# Patient Record
Sex: Female | Born: 1985 | Race: White | Hispanic: Yes | Marital: Single | Smoking: Never smoker
Health system: Southern US, Community
[De-identification: ages and names within clinical notes are randomized; demographics above are authoritative.]

---
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
## 2005-06-30 ENCOUNTER — Inpatient Hospital Stay (HOSPITAL_COMMUNITY): Admission: AD | Admit: 2005-06-30 | Discharge: 2005-06-30 | Payer: Self-pay | Admitting: Obstetrics

## 2005-08-20 ENCOUNTER — Inpatient Hospital Stay (HOSPITAL_COMMUNITY): Admission: AD | Admit: 2005-08-20 | Discharge: 2005-08-22 | Payer: Self-pay | Admitting: Obstetrics

## 2007-06-14 ENCOUNTER — Inpatient Hospital Stay (HOSPITAL_COMMUNITY): Admission: AD | Admit: 2007-06-14 | Discharge: 2007-06-14 | Payer: Self-pay | Admitting: Obstetrics

## 2007-06-26 ENCOUNTER — Inpatient Hospital Stay (HOSPITAL_COMMUNITY): Admission: AD | Admit: 2007-06-26 | Discharge: 2007-06-26 | Payer: Self-pay | Admitting: Obstetrics

## 2007-08-08 ENCOUNTER — Encounter (INDEPENDENT_AMBULATORY_CARE_PROVIDER_SITE_OTHER): Payer: Self-pay | Admitting: Obstetrics

## 2007-08-08 ENCOUNTER — Inpatient Hospital Stay (HOSPITAL_COMMUNITY): Admission: RE | Admit: 2007-08-08 | Discharge: 2007-08-11 | Payer: Self-pay | Admitting: Obstetrics

## 2010-05-19 NOTE — Op Note (Signed)
NAMECHARLI, [name] Stewart           ACCOUNT NO.:  [id number]   MEDICAL RECORD NO.:  [id number]          PATIENT TYPE:  INP   LOCATION:  9132                          FACILITY:  [facility]   PHYSICIAN:  Kathreen Cosier, M.D.DATE OF BIRTH:  1985-09-05   DATE OF PROCEDURE:  08/08/2007  DATE OF DISCHARGE:                               OPERATIVE REPORT   PREOPERATIVE DIAGNOSIS:  Twin gestation at term with both breech  presentation.   POSTOPERATIVE DIAGNOSIS:  Twin gestation at term with both breech  presentation.   SURGEON:  Kathreen Cosier, MD   FIRST ASSISTANT:  Charles A. Clearance Coots, MD   ANESTHESIA:  Spinal.   PROCEDURE:  The patient was placed on the operating table in supine  position after the spinal administered.  Abdomen was prepped and draped.  Bladder was emptied with Foley catheter.  A transverse suprapubic  incision made and carried down through the rectus fascia.  Fascia was  cleaned and incised length of the incision.  Recti muscles were  retracted laterally.  Peritoneum was incised longitudinally.  A  transverse incision was made in the visceral peritoneum above the  bladder.  Bladder was mobilized inferiorly.  Transverse lower uterine  incision was made.  The membranes were ruptured around twin A and the  fluid was clear.  A frank breech female at 10:43, Apgar 8 and 8, weighing  6 pounds 11 ounces.  Twin B, the double footling breech.  The membranes  were ruptured.  Fluid clear and delivered via breech extraction at 10:45  of a female, Apgar 5 and 8, weighing 5 pounds 14 ounces.  Had two  placentas and they were removed manually and sent to pathology.  Uterine  cavity cleaned with dry laps.  Uterine incision closed in one layer with  continuous    Dictation ended at this point.           ______________________________  Kathreen Cosier, M.D.     BAM/MEDQ  D:  08/08/2007  T:  08/09/2007  Job:  [id number]

## 2010-05-22 NOTE — Discharge Summary (Signed)
[name], [name] NO.:  [id number]   MEDICAL RECORD NO.:  [id number]          PATIENT TYPE:  MAT   LOCATION:  MATC                          [facility]  [facility]   PHYSICIAN:  Kathreen Cosier, M.D.DATE OF BIRTH:  02-03-1985   DATE OF ADMISSION:  DATE OF DISCHARGE:                               DISCHARGE SUMMARY   The patient is a 25 year old gravida 3, para 2-0-0-2, [facility] August 27, 2007, who was pregnant with twin gestation at term, both breech.  She  was followed with nonstress tests and ultrasound at [facility] and  then she was brought in for C-section, because of the twins at term,  breech.  She underwent a primary low-transverse cesarean section, twin A  with Apgar 8 and 8, weighing 6 pounds 11.  Twin B, female, Apgar 8 and  8,  weighing 5 pounds 14, double footling.  Postop, the patient did  well.  Her hemoglobin was 8.8 and she was started on ferrous sulfate  twice a day.  She was discharged on the third postoperative day.  Ambulatory, on a regular diet, and to see me in 6 weeks.   DISCHARGE DIAGNOSIS:  Status post primary low-transverse cesarean  section at term for twin gestation, both breech.           ______________________________  Kathreen Cosier, M.D.     BAM/MEDQ  D:  08/23/2007  T:  08/23/2007  Job:  [id number]

## 2010-10-01 LAB — FETAL FIBRONECTIN: Fetal Fibronectin: NEGATIVE

## 2010-10-01 LAB — RH IMMUNE GLOBULIN WORKUP (NOT WOMEN'S HOSP)
ABO/RH(D): B NEG
Antibody Screen: NEGATIVE

## 2010-10-01 LAB — WET PREP, GENITAL: Trich, Wet Prep: NONE SEEN

## 2010-10-02 LAB — RH IMMUNE GLOB WKUP(>/=20WKS)(NOT WOMEN'S HOSP)

## 2010-10-02 LAB — CBC
HCT: 26.2 — ABNORMAL LOW
HCT: 37.5
Hemoglobin: 12.5
Hemoglobin: 8.8 — ABNORMAL LOW
MCHC: 33.6
MCV: 94.2
Platelets: 159
Platelets: 219
RBC: 2.78 — ABNORMAL LOW
RBC: 3.98
WBC: 7.9

## 2014-10-17 LAB — OB RESULTS CONSOLE HEPATITIS B SURFACE ANTIGEN: HEP B S AG: NEGATIVE

## 2014-10-17 LAB — OB RESULTS CONSOLE RUBELLA ANTIBODY, IGM: RUBELLA: NON-IMMUNE/NOT IMMUNE

## 2014-10-17 LAB — OB RESULTS CONSOLE RPR: RPR: NONREACTIVE

## 2014-10-17 LAB — OB RESULTS CONSOLE HIV ANTIBODY (ROUTINE TESTING): HIV: NONREACTIVE

## 2014-10-17 LAB — OB RESULTS CONSOLE ABO/RH: RH TYPE: NEGATIVE

## 2014-10-17 LAB — OB RESULTS CONSOLE GC/CHLAMYDIA
Chlamydia: NEGATIVE
Gonorrhea: NEGATIVE

## 2015-03-13 ENCOUNTER — Encounter (HOSPITAL_COMMUNITY): Payer: Self-pay

## 2015-03-13 ENCOUNTER — Other Ambulatory Visit (HOSPITAL_COMMUNITY): Payer: Self-pay | Admitting: Obstetrics

## 2015-03-13 ENCOUNTER — Ambulatory Visit (HOSPITAL_COMMUNITY)
Admission: RE | Admit: 2015-03-13 | Discharge: 2015-03-13 | Disposition: A | Discharge status: Home / Self Care | Payer: Self-pay | Source: Ambulatory Visit | Attending: Obstetrics | Admitting: Obstetrics

## 2015-03-13 DIAGNOSIS — Z3A28 28 weeks gestation of pregnancy: Secondary | ICD-10-CM

## 2015-03-13 DIAGNOSIS — O30042 Twin pregnancy, dichorionic/diamniotic, second trimester: Secondary | ICD-10-CM

## 2015-03-13 DIAGNOSIS — Z3689 Encounter for other specified antenatal screening: Secondary | ICD-10-CM

## 2015-03-13 DIAGNOSIS — O30043 Twin pregnancy, dichorionic/diamniotic, third trimester: Secondary | ICD-10-CM | POA: Insufficient documentation

## 2015-03-13 DIAGNOSIS — Z36 Encounter for antenatal screening of mother: Secondary | ICD-10-CM | POA: Insufficient documentation

## 2015-04-03 ENCOUNTER — Other Ambulatory Visit (HOSPITAL_COMMUNITY): Payer: Self-pay | Admitting: Obstetrics

## 2015-04-03 DIAGNOSIS — O30043 Twin pregnancy, dichorionic/diamniotic, third trimester: Secondary | ICD-10-CM

## 2015-04-03 DIAGNOSIS — Z3A28 28 weeks gestation of pregnancy: Secondary | ICD-10-CM

## 2015-04-14 ENCOUNTER — Other Ambulatory Visit (HOSPITAL_COMMUNITY): Payer: Self-pay | Admitting: Obstetrics

## 2015-04-14 ENCOUNTER — Ambulatory Visit (HOSPITAL_COMMUNITY)
Admission: RE | Admit: 2015-04-14 | Discharge: 2015-04-14 | Disposition: A | Discharge status: Home / Self Care | Payer: Self-pay | Source: Ambulatory Visit | Attending: Obstetrics | Admitting: Obstetrics

## 2015-04-14 DIAGNOSIS — Z3A28 28 weeks gestation of pregnancy: Secondary | ICD-10-CM

## 2015-04-14 DIAGNOSIS — O30043 Twin pregnancy, dichorionic/diamniotic, third trimester: Secondary | ICD-10-CM

## 2015-04-14 DIAGNOSIS — Z3A33 33 weeks gestation of pregnancy: Secondary | ICD-10-CM

## 2015-05-01 ENCOUNTER — Other Ambulatory Visit (HOSPITAL_COMMUNITY): Payer: Self-pay | Admitting: Obstetrics

## 2015-05-01 ENCOUNTER — Other Ambulatory Visit: Payer: Self-pay | Admitting: Obstetrics

## 2015-05-01 DIAGNOSIS — O30009 Twin pregnancy, unspecified number of placenta and unspecified number of amniotic sacs, unspecified trimester: Secondary | ICD-10-CM

## 2015-05-01 DIAGNOSIS — Z3A39 39 weeks gestation of pregnancy: Secondary | ICD-10-CM

## 2015-05-01 DIAGNOSIS — O321XX1 Maternal care for breech presentation, fetus 1: Secondary | ICD-10-CM

## 2015-05-01 LAB — OB RESULTS CONSOLE GBS: GBS: POSITIVE

## 2015-05-12 ENCOUNTER — Encounter (HOSPITAL_COMMUNITY): Payer: Self-pay

## 2015-05-12 ENCOUNTER — Telehealth (HOSPITAL_COMMUNITY): Payer: Self-pay | Admitting: *Deleted

## 2015-05-12 NOTE — Telephone Encounter (Signed)
Preadmission screen  

## 2015-05-13 ENCOUNTER — Encounter (HOSPITAL_COMMUNITY): Payer: Self-pay

## 2015-05-13 NOTE — Telephone Encounter (Signed)
Interpreter number [phone]

## 2015-05-13 NOTE — H&P (Signed)
NAMFransisca Stewart:  Stewart, [name]           ACCOUNT NO.:  [id number]  MEDICAL RECORD NO.:  [id number]  LOCATION:  MFM                           [facility]ILITY:  [facility]  PHYSICIAN:  Kathreen CosierBernard A. Dorenda Pfannenstiel, M.D.DATE OF BIRTH:  [birth year]  DATE OF ADMISSION:  04/14/2015 DATE OF DISCHARGE:  04/14/2015                             HISTORY & PHYSICAL   HISTORY OF PRESENT ILLNESS:  The patient is a 30 year old, gravida 5, para 4-0-0-4, who had 1 C section in the past.  She is pregnant with twins due Jun 01, 2015.  Throughout this pregnancy twin A has been breech and the patient is for repeat C-section.  She also has a positive GBS and her initial Glucola screen was abnormal.  She had 3 hour GTT which was normal.  She was also Rh negative and received RhoGAM.  PAST MEDICAL HISTORY:  Negative.  PAST SURGICAL HISTORY:  She had a C-section x1 and 2 vaginal deliveries. The last C-section in 09 was for twin gestation also.  SOCIAL HISTORY:  Negative.  SYSTEM REVIEW:  Negative.  PHYSICAL EXAM:  GENERAL:  Well-developed female in no distress. HEENT:  Negative. LUNGS:  Clear to P and A. HEART:  Regular rhythm.  No murmurs, no gallops. Breasts:  Negative. ABDOMEN:  Distended with twin gestation. PELVIC:  Cervix closed. EXTREMITIES:  Negative.          ______________________________ Kathreen CosierBernard A. [name] Stewart, M.D.     BAM/MEDQ  D:  05/12/2015  T:  05/13/2015  Job:  [id number]

## 2015-05-16 ENCOUNTER — Ambulatory Visit (HOSPITAL_COMMUNITY): Admission: RE | Admit: 2015-05-16 | Payer: Self-pay | Source: Ambulatory Visit

## 2015-05-19 ENCOUNTER — Encounter (HOSPITAL_COMMUNITY)
Admission: RE | Admit: 2015-05-19 | Discharge: 2015-05-19 | Disposition: A | Discharge status: Home / Self Care | Payer: Self-pay | Source: Ambulatory Visit | Attending: Obstetrics | Admitting: Obstetrics

## 2015-05-19 DIAGNOSIS — Z01812 Encounter for preprocedural laboratory examination: Secondary | ICD-10-CM | POA: Insufficient documentation

## 2015-05-19 DIAGNOSIS — Z3483 Encounter for supervision of other normal pregnancy, third trimester: Secondary | ICD-10-CM | POA: Insufficient documentation

## 2015-05-19 LAB — CBC
HEMATOCRIT: 33.4 % — AB (ref 36.0–46.0)
HEMOGLOBIN: 11.5 g/dL — AB (ref 12.0–15.0)
MCH: 31.5 pg (ref 26.0–34.0)
MCHC: 34.4 g/dL (ref 30.0–36.0)
MCV: 91.5 fL (ref 78.0–100.0)
Platelets: 138 10*3/uL — ABNORMAL LOW (ref 150–400)
RBC: 3.65 MIL/uL — AB (ref 3.87–5.11)
RDW: 12.7 % (ref 11.5–15.5)
WBC: 6 10*3/uL (ref 4.0–10.5)

## 2015-05-19 NOTE — Patient Instructions (Signed)
20 [name]  05/19/2015   Your procedure is scheduled on: 05/20/2015  [facility] through the [facility] Entr[facility] of [facility] at 0600 AM.  Pick up the phone at the desk and dial [phone].   Call this number if you have problems the morning of surgery: [phone]   Remember:   Do not eat food:After Midnight.  Do not drink clear liquids: After Midnight.  Take these medicines the morning of surgery with A SIP OF WATER: none   Do not wear jewelry, make-up or nail polish.  Do not wear lotions, powders, or perfumes. You may wear deodorant.  Do not shave 48 hours prior to surgery.  Do not bring valuables to the hospital.  [facility] is not   responsible for any belongings or valuables brought to the hospital.  Contacts, dentures or bridgework may not be worn into surgery.  Leave suitcase in the car. After surgery it may be brought to your room.  For patients admitted to the hospital, checkout time is 11:00 AM the day of              discharge.   Patients discharged the day of surgery will not be allowed to drive             home.  Name and phone number of your driver: na  Special Instructions:   Shower using CHG 2 nights before surgery and the night before surgery.  If you shower the day of surgery use CHG.  Use special wash - you have one bottle of CHG for all showers.  You should use approximately 1/3 of the bottle for each shower.   Please read over the following fact sheets that you were given:   Surgical Site Infection Prevention

## 2015-05-19 NOTE — Anesthesia Preprocedure Evaluation (Addendum)
Anesthesia Evaluation  Patient identified by MRN, date of birth, ID band Patient awake    Reviewed: Allergy & Precautions, NPO status , Patient's Chart, lab work & pertinent test results  Airway Mallampati: II  TM Distance: >3 FB Neck ROM: Full    Dental  (+) Teeth Intact   Pulmonary neg pulmonary ROS,    breath sounds clear to auscultation       Cardiovascular negative cardio ROS   Rhythm:Regular Rate:Normal     Neuro/Psych negative neurological ROS     GI/Hepatic negative GI ROS, Neg liver ROS,   Endo/Other  negative endocrine ROS  Renal/GU negative Renal ROS     Musculoskeletal   Abdominal   Peds  Hematology Plts 138K   Anesthesia Other Findings   Reproductive/Obstetrics (+) Pregnancy Previous c/s x1. Vaginal delivery x 2. Now with twin pregnancy with twin A breech for repeat c/s.                            Lab Results  Component Value Date   WBC 6.0 05/19/2015   HGB 11.5* 05/19/2015   HCT 33.4* 05/19/2015   MCV 91.5 05/19/2015   PLT 138* 05/19/2015   No results found for: CREATININE, BUN, NA, K, CL, CO2  Anesthesia Physical Anesthesia Plan  ASA: II  Anesthesia Plan: Spinal   Post-op Pain Management:    Induction:   Airway Management Planned: Natural Airway  Additional Equipment:   Intra-op Plan:   Post-operative Plan:   Informed Consent: I have reviewed the patients History and Physical, chart, labs and discussed the procedure including the risks, benefits and alternatives for the proposed anesthesia with the patient or authorized representative who has indicated his/her understanding and acceptance.     Plan Discussed with: CRNA  Anesthesia Plan Comments:        Anesthesia Quick Evaluation

## 2015-05-20 ENCOUNTER — Inpatient Hospital Stay (HOSPITAL_COMMUNITY): Payer: Medicaid Other | Admitting: Anesthesiology

## 2015-05-20 ENCOUNTER — Inpatient Hospital Stay (HOSPITAL_COMMUNITY)
Admission: RE | Admit: 2015-05-20 | Discharge: 2015-05-23 | DRG: 765 | Disposition: A | Discharge status: Home / Self Care | Payer: Medicaid Other | Source: Ambulatory Visit | Attending: Obstetrics | Admitting: Obstetrics

## 2015-05-20 ENCOUNTER — Encounter (HOSPITAL_COMMUNITY)
Admission: RE | Disposition: A | Discharge status: Home / Self Care | Payer: Self-pay | Source: Ambulatory Visit | Attending: Obstetrics

## 2015-05-20 ENCOUNTER — Encounter (HOSPITAL_COMMUNITY): Payer: Self-pay

## 2015-05-20 DIAGNOSIS — Z3A38 38 weeks gestation of pregnancy: Secondary | ICD-10-CM

## 2015-05-20 DIAGNOSIS — O30043 Twin pregnancy, dichorionic/diamniotic, third trimester: Secondary | ICD-10-CM | POA: Diagnosis present

## 2015-05-20 DIAGNOSIS — O328XX2 Maternal care for other malpresentation of fetus, fetus 2: Secondary | ICD-10-CM | POA: Diagnosis present

## 2015-05-20 DIAGNOSIS — O30013 Twin pregnancy, monochorionic/monoamniotic, third trimester: Secondary | ICD-10-CM | POA: Diagnosis not present

## 2015-05-20 DIAGNOSIS — O328XX1 Maternal care for other malpresentation of fetus, fetus 1: Principal | ICD-10-CM | POA: Diagnosis present

## 2015-05-20 DIAGNOSIS — O30003 Twin pregnancy, unspecified number of placenta and unspecified number of amniotic sacs, third trimester: Secondary | ICD-10-CM | POA: Diagnosis not present

## 2015-05-20 DIAGNOSIS — Z98891 History of uterine scar from previous surgery: Secondary | ICD-10-CM

## 2015-05-20 LAB — RPR: RPR: NONREACTIVE

## 2015-05-20 LAB — PREPARE RBC (CROSSMATCH)

## 2015-05-20 SURGERY — Surgical Case
Anesthesia: Spinal

## 2015-05-20 MED ORDER — LACTATED RINGERS IV SOLN
INTRAVENOUS | Status: DC | PRN
  Administered 2015-05-20: 08:00:00 via INTRAVENOUS

## 2015-05-20 MED ORDER — SCOPOLAMINE 1 MG/3DAYS TD PT72
1.0000 | MEDICATED_PATCH | Freq: Once | TRANSDERMAL | Status: DC
  Administered 2015-05-20: 1.5 mg via TRANSDERMAL

## 2015-05-20 MED ORDER — LACTATED RINGERS IV SOLN
Freq: Once | INTRAVENOUS | Status: AC
  Administered 2015-05-20: 07:00:00 via INTRAVENOUS

## 2015-05-20 MED ORDER — NALBUPHINE HCL 10 MG/ML IJ SOLN: 5.0000 mg | Freq: Once | INTRAMUSCULAR | Status: DC | PRN

## 2015-05-20 MED ORDER — DIPHENHYDRAMINE HCL 25 MG PO CAPS
25.0000 mg | ORAL_CAPSULE | ORAL | Status: DC | PRN
  Filled 2015-05-20: qty 1

## 2015-05-20 MED ORDER — MORPHINE SULFATE (PF) 0.5 MG/ML IJ SOLN
INTRAMUSCULAR | Status: AC
  Filled 2015-05-20: qty 10

## 2015-05-20 MED ORDER — SENNOSIDES-DOCUSATE SODIUM 8.6-50 MG PO TABS
2.0000 | ORAL_TABLET | ORAL | Status: DC
  Administered 2015-05-21 – 2015-05-23 (×3): 2 via ORAL
  Filled 2015-05-20 (×3): qty 2

## 2015-05-20 MED ORDER — DIPHENHYDRAMINE HCL 25 MG PO CAPS: 25.0000 mg | ORAL_CAPSULE | ORAL | Status: DC | PRN

## 2015-05-20 MED ORDER — OXYCODONE HCL 5 MG PO TABS
5.0000 mg | ORAL_TABLET | Freq: Once | ORAL | Status: AC
  Administered 2015-05-20: 5 mg via ORAL
  Filled 2015-05-20: qty 1

## 2015-05-20 MED ORDER — OXYTOCIN 10 UNIT/ML IJ SOLN
40.0000 [IU] | INTRAVENOUS | Status: DC | PRN
  Administered 2015-05-20: 40 [IU] via INTRAVENOUS

## 2015-05-20 MED ORDER — OXYTOCIN 40 UNITS IN LACTATED RINGERS INFUSION - SIMPLE MED: 2.5000 [IU]/h | INTRAVENOUS | Status: AC

## 2015-05-20 MED ORDER — PHENYLEPHRINE 8 MG IN D5W 100 ML (0.08MG/ML) PREMIX OPTIME
INJECTION | INTRAVENOUS | Status: DC | PRN
  Administered 2015-05-20: 60 ug/min via INTRAVENOUS

## 2015-05-20 MED ORDER — MEPERIDINE HCL 25 MG/ML IJ SOLN: 6.2500 mg | INTRAMUSCULAR | Status: DC | PRN

## 2015-05-20 MED ORDER — KETOROLAC TROMETHAMINE 30 MG/ML IJ SOLN: 30.0000 mg | Freq: Four times a day (QID) | INTRAMUSCULAR | Status: DC | PRN

## 2015-05-20 MED ORDER — SCOPOLAMINE 1 MG/3DAYS TD PT72
1.0000 | MEDICATED_PATCH | Freq: Once | TRANSDERMAL | Status: DC
Start: 2015-05-20 — End: 2015-05-20

## 2015-05-20 MED ORDER — NALBUPHINE HCL 10 MG/ML IJ SOLN: 5.0000 mg | INTRAMUSCULAR | Status: DC | PRN

## 2015-05-20 MED ORDER — SIMETHICONE 80 MG PO CHEW: 80.0000 mg | CHEWABLE_TABLET | ORAL | Status: DC | PRN

## 2015-05-20 MED ORDER — SODIUM CHLORIDE 0.9 % IR SOLN
Status: DC | PRN
  Administered 2015-05-20: 1000 mL

## 2015-05-20 MED ORDER — SODIUM CHLORIDE 0.9% FLUSH: 3.0000 mL | INTRAVENOUS | Status: DC | PRN

## 2015-05-20 MED ORDER — NALOXONE HCL 0.4 MG/ML IJ SOLN: 0.4000 mg | INTRAMUSCULAR | Status: DC | PRN

## 2015-05-20 MED ORDER — ONDANSETRON HCL 4 MG/2ML IJ SOLN: 4.0000 mg | Freq: Three times a day (TID) | INTRAMUSCULAR | Status: DC | PRN

## 2015-05-20 MED ORDER — DIBUCAINE 1 % RE OINT: 1.0000 "application " | TOPICAL_OINTMENT | RECTAL | Status: DC | PRN

## 2015-05-20 MED ORDER — ACETAMINOPHEN 500 MG PO TABS
1000.0000 mg | ORAL_TABLET | Freq: Once | ORAL | Status: AC
Start: 2015-05-20 — End: 2015-05-20
  Administered 2015-05-20: 1000 mg via ORAL
  Filled 2015-05-20: qty 2

## 2015-05-20 MED ORDER — OXYTOCIN 10 UNIT/ML IJ SOLN
INTRAMUSCULAR | Status: AC
  Filled 2015-05-20: qty 4

## 2015-05-20 MED ORDER — FENTANYL CITRATE (PF) 100 MCG/2ML IJ SOLN
INTRAMUSCULAR | Status: AC
  Filled 2015-05-20: qty 2

## 2015-05-20 MED ORDER — PRENATAL MULTIVITAMIN CH
1.0000 | ORAL_TABLET | Freq: Every day | ORAL | Status: DC
  Administered 2015-05-20 – 2015-05-22 (×3): 1 via ORAL
  Filled 2015-05-20 (×3): qty 1

## 2015-05-20 MED ORDER — PHENYLEPHRINE 8 MG IN D5W 100 ML (0.08MG/ML) PREMIX OPTIME
INJECTION | INTRAVENOUS | Status: AC
  Filled 2015-05-20: qty 100

## 2015-05-20 MED ORDER — NALOXONE HCL 2 MG/2ML IJ SOSY
1.0000 ug/kg/h | PREFILLED_SYRINGE | INTRAVENOUS | Status: DC | PRN
  Filled 2015-05-20: qty 2

## 2015-05-20 MED ORDER — METHYLERGONOVINE MALEATE 0.2 MG/ML IJ SOLN
0.2000 mg | Freq: Once | INTRAMUSCULAR | Status: AC
  Administered 2015-05-20: 0.2 mg via INTRAMUSCULAR

## 2015-05-20 MED ORDER — SIMETHICONE 80 MG PO CHEW
80.0000 mg | CHEWABLE_TABLET | ORAL | Status: DC
  Administered 2015-05-21 – 2015-05-23 (×4): 80 mg via ORAL
  Filled 2015-05-20 (×3): qty 1

## 2015-05-20 MED ORDER — DIPHENHYDRAMINE HCL 25 MG PO CAPS: 25.0000 mg | ORAL_CAPSULE | Freq: Four times a day (QID) | ORAL | Status: DC | PRN

## 2015-05-20 MED ORDER — ONDANSETRON HCL 4 MG/2ML IJ SOLN
INTRAMUSCULAR | Status: AC
  Filled 2015-05-20: qty 2

## 2015-05-20 MED ORDER — CEFAZOLIN SODIUM-DEXTROSE 2-4 GM/100ML-% IV SOLN
2.0000 g | INTRAVENOUS | Status: AC
  Administered 2015-05-20: 2 g via INTRAVENOUS

## 2015-05-20 MED ORDER — ONDANSETRON HCL 4 MG/2ML IJ SOLN
INTRAMUSCULAR | Status: DC | PRN
  Administered 2015-05-20: 4 mg via INTRAVENOUS

## 2015-05-20 MED ORDER — SIMETHICONE 80 MG PO CHEW
80.0000 mg | CHEWABLE_TABLET | Freq: Three times a day (TID) | ORAL | Status: DC
  Administered 2015-05-20 – 2015-05-23 (×7): 80 mg via ORAL
  Filled 2015-05-20 (×8): qty 1

## 2015-05-20 MED ORDER — SCOPOLAMINE 1 MG/3DAYS TD PT72: 1.0000 | MEDICATED_PATCH | Freq: Once | TRANSDERMAL | Status: DC

## 2015-05-20 MED ORDER — DIPHENHYDRAMINE HCL 50 MG/ML IJ SOLN: 12.5000 mg | INTRAMUSCULAR | Status: DC | PRN

## 2015-05-20 MED ORDER — LACTATED RINGERS IV SOLN
INTRAVENOUS | Status: DC
  Administered 2015-05-20: 125 mL/h via INTRAVENOUS

## 2015-05-20 MED ORDER — MENTHOL 3 MG MT LOZG: 1.0000 | LOZENGE | OROMUCOSAL | Status: DC | PRN

## 2015-05-20 MED ORDER — DEXTROSE 5 % IV SOLN: 1.0000 ug/kg/h | INTRAVENOUS | Status: DC | PRN

## 2015-05-20 MED ORDER — SCOPOLAMINE 1 MG/3DAYS TD PT72
MEDICATED_PATCH | TRANSDERMAL | Status: AC
  Administered 2015-05-20: 1.5 mg via TRANSDERMAL
  Filled 2015-05-20: qty 1

## 2015-05-20 MED ORDER — ACETAMINOPHEN 325 MG PO TABS: 650.0000 mg | ORAL_TABLET | ORAL | Status: DC | PRN

## 2015-05-20 MED ORDER — LACTATED RINGERS IV SOLN
INTRAVENOUS | Status: DC | PRN
  Administered 2015-05-20 (×2): via INTRAVENOUS

## 2015-05-20 MED ORDER — OXYCODONE-ACETAMINOPHEN 5-325 MG PO TABS
1.0000 | ORAL_TABLET | ORAL | Status: DC | PRN
  Administered 2015-05-21: 2 via ORAL
  Administered 2015-05-21 (×2): 1 via ORAL
  Administered 2015-05-22 (×3): 2 via ORAL
  Filled 2015-05-20: qty 2
  Filled 2015-05-20 (×2): qty 1
  Filled 2015-05-20 (×3): qty 2

## 2015-05-20 MED ORDER — KETOROLAC TROMETHAMINE 30 MG/ML IJ SOLN: 30.0000 mg | Freq: Four times a day (QID) | INTRAMUSCULAR | Status: AC | PRN

## 2015-05-20 MED ORDER — WITCH HAZEL-GLYCERIN EX PADS: 1.0000 "application " | MEDICATED_PAD | CUTANEOUS | Status: DC | PRN

## 2015-05-20 MED ORDER — FENTANYL CITRATE (PF) 100 MCG/2ML IJ SOLN
INTRAMUSCULAR | Status: DC | PRN
  Administered 2015-05-20: 10 ug via INTRATHECAL

## 2015-05-20 MED ORDER — BUPIVACAINE IN DEXTROSE 0.75-8.25 % IT SOLN
INTRATHECAL | Status: DC | PRN
  Administered 2015-05-20: 1.6 mL via INTRATHECAL

## 2015-05-20 MED ORDER — TETANUS-DIPHTH-ACELL PERTUSSIS 5-2.5-18.5 LF-MCG/0.5 IM SUSP
0.5000 mL | Freq: Once | INTRAMUSCULAR | Status: AC
  Administered 2015-05-21: 0.5 mL via INTRAMUSCULAR
  Filled 2015-05-20: qty 0.5

## 2015-05-20 MED ORDER — COCONUT OIL OIL: 1.0000 "application " | TOPICAL_OIL | Status: DC | PRN

## 2015-05-20 MED ORDER — KETOROLAC TROMETHAMINE 30 MG/ML IJ SOLN
INTRAMUSCULAR | Status: AC
  Filled 2015-05-20: qty 1

## 2015-05-20 MED ORDER — CEFAZOLIN SODIUM-DEXTROSE 2-3 GM-% IV SOLR
INTRAVENOUS | Status: AC
  Filled 2015-05-20: qty 50

## 2015-05-20 MED ORDER — MORPHINE SULFATE (PF) 0.5 MG/ML IJ SOLN
INTRAMUSCULAR | Status: DC | PRN
  Administered 2015-05-20: .2 mg via INTRATHECAL

## 2015-05-20 MED ORDER — KETOROLAC TROMETHAMINE 30 MG/ML IJ SOLN
30.0000 mg | Freq: Four times a day (QID) | INTRAMUSCULAR | Status: DC | PRN
  Administered 2015-05-20: 30 mg via INTRAMUSCULAR

## 2015-05-20 MED ORDER — ZOLPIDEM TARTRATE 5 MG PO TABS: 5.0000 mg | ORAL_TABLET | Freq: Every evening | ORAL | Status: DC | PRN

## 2015-05-20 MED ORDER — IBUPROFEN 600 MG PO TABS
600.0000 mg | ORAL_TABLET | Freq: Four times a day (QID) | ORAL | Status: DC
  Administered 2015-05-20 – 2015-05-23 (×11): 600 mg via ORAL
  Filled 2015-05-20 (×11): qty 1

## 2015-05-20 SURGICAL SUPPLY — 21 items
CLAMP CORD UMBIL (MISCELLANEOUS) ×2 IMPLANT
CLOTH BEACON ORANGE TIMEOUT ST (SAFETY) ×2 IMPLANT
DRSG OPSITE POSTOP 4X10 (GAUZE/BANDAGES/DRESSINGS) ×4 IMPLANT
DURAPREP 26ML APPLICATOR (WOUND CARE) ×2 IMPLANT
GLOVE BIO SURGEON STRL SZ8.5 (GLOVE) ×2 IMPLANT
GLOVE BIOGEL PI IND STRL 7.0 (GLOVE) IMPLANT
GLOVE BIOGEL PI INDICATOR 7.0 (GLOVE) ×2
GOWN STRL REUS W/TWL 2XL LVL3 (GOWN DISPOSABLE) ×2 IMPLANT
GOWN STRL REUS W/TWL LRG LVL3 (GOWN DISPOSABLE) ×6 IMPLANT
LIQUID BAND (GAUZE/BANDAGES/DRESSINGS) ×2 IMPLANT
NS IRRIG 1000ML POUR BTL (IV SOLUTION) ×2 IMPLANT
PACK C SECTION WH (CUSTOM PROCEDURE TRAY) ×2 IMPLANT
PAD OB MATERNITY 4.3X12.25 (PERSONAL CARE ITEMS) ×2 IMPLANT
PENCIL SMOKE EVAC W/HOLSTER (ELECTROSURGICAL) ×2 IMPLANT
RETRACTOR TRAXI PANNICULUS (MISCELLANEOUS) IMPLANT
SUT CHROMIC 1 CTX 36 (SUTURE) ×6 IMPLANT
SUT MON AB 4-0 PS1 27 (SUTURE) ×4 IMPLANT
SUT VIC AB 0 CTX 36 (SUTURE) ×6
SUT VIC AB 0 CTX36XBRD ANBCTRL (SUTURE) IMPLANT
TRAXI PANNICULUS RETRACTOR (MISCELLANEOUS) ×2
TRAY FOLEY CATH SILVER 14FR (SET/KITS/TRAYS/PACK) ×2 IMPLANT

## 2015-05-20 NOTE — Addendum Note (Signed)
Addendum  created 05/20/15 1535 by Marcene Duosobert Serafina Topham, MD   Modules edited: Orders, PRL Based Order Sets

## 2015-05-20 NOTE — Anesthesia Postprocedure Evaluation (Signed)
Anesthesia Post Note  Patient: [name] Stewart  Procedure(s) Performed: Procedure(s) (LRB): CESAREAN SECTION MULTI-GESTATIONAL (N/A)  Patient location during evaluation: PACU Anesthesia Type: Spinal and MAC Level of consciousness: awake and alert Pain management: pain level controlled Vital Signs Assessment: post-procedure vital signs reviewed and stable Respiratory status: spontaneous breathing and respiratory function stable Cardiovascular status: blood pressure returned to baseline and stable Postop Assessment: spinal receding Anesthetic complications: no     Last Vitals:  Filed Vitals:   05/20/15 0845 05/20/15 0846  BP: 132/65   Pulse: 55 57  Temp:    Resp: 13 9    Last Pain: There were no vitals filed for this visit. Pain Goal: Patients Stated Pain Goal: 4 (05/20/15 0609)               Kennieth RadFitzgerald, Honora Searson E

## 2015-05-20 NOTE — Anesthesia Procedure Notes (Signed)
Spinal  Staffing Anesthesiologist: Marcene DuosFITZGERALD, Mohd. Derflinger Performed by: anesthesiologist  Preanesthetic Checklist Completed: patient identified, site marked, surgical consent, pre-op evaluation, timeout performed, IV checked, risks and benefits discussed and monitors and equipment checked Spinal Block Patient position: sitting Prep: site prepped and draped and DuraPrep Patient monitoring: heart rate, continuous pulse ox and blood pressure Approach: midline Location: L3-4 Injection technique: single-shot Needle Needle type: Pencan  Needle gauge: 24 G Needle length: 9 cm Assessment Sensory level: T6

## 2015-05-20 NOTE — Op Note (Signed)
Preop diagnosis twin gestation at term breech breech presentation Postop diagnosis is same Surgeon Dr. Francoise CeoBernard Tineka Uriegas First assistant Dr. Angelica Ranhilds Hopper Anesthesia spinal Procedure patient placed on the operating table in the supine position after the spinal administered abdomen prepped and draped bladder emptied with a Foley catheter a transverse suprapubic incision made carried him to the rectus fascia fascia cleaned and incised length of the incision recti muscles retracted laterally peritoneum incised longitudinally transverse incision made in the uterus and twin A was a double footling breech female Apgar 99 fluid was clear twin B fluid clear footling breech membranes ruptured artificially and the twin be delivered in the usual manner has a breech 2 placentas removed manually uterine cavity clean with dry laps twin B Apgar 9 and 9 female uterine cavity clean with dry laps uterine incision closed in one layer with continuous suture of #1 chromic hemostasis satisfactory lap and sponge counts correct abdomen closed in layers peritoneum continuous suture of 0 chromic fascia continuous suture of 0 Dexon skin closed with subcuticular stitch of 4-0 Monocryl blood loss was 700 cc patient tolerated the procedure well taken to recovery room in good condition

## 2015-05-20 NOTE — Progress Notes (Signed)
I was present during the C-Section with  Dr Gaynell FaceMarshall and Dr Sampson GoonFitzgerald by Orlan LeavensViria Alvarez Spanish Interpreter.

## 2015-05-20 NOTE — Addendum Note (Signed)
Addendum  created 05/20/15 1541 by Graciela HusbandsWynn O Densel Kronick, CRNA   Modules edited: Notes Section   Notes Section:  File: [id number]

## 2015-05-20 NOTE — Lactation Note (Signed)
This note was copied from a baby's chart. Lactation Consultation Note Initial visit at 13 hours of age.  Mom reports good feedings and denies pain or concerns.  Baby boy A had a recent bottle feeding of 23 mls.  Baby girl B had 2 recent breastfeedings.  Mom reports she plans to alternate twins with only 1 baby breastfeeding at a time and the other getting a bottle of formula.  Mom reports this worked with her older set of twins.  Mom aware of how this affects her milk supply.  Offered for mom to pump with DEBP, mom declined.  Mom reports she may use DEBP at home, but [facility] and doesn't [facility].  Lc questions mom commitment to pumping. [facility] resources given and discussed.  Encouraged to feed with early cues on demand.  Early newborn behavior discussed.  Hand expression reported by mom with colostrum visible. Reviewed feeding log and asked parents to keep a good record of feedings.    Mom to call for assist as needed.     Patient Name: Danise EdgeBoyA Dalary Manigo Today's Date: 05/20/2015 Reason for consult: Initial assessment;Multiple gestation   Maternal Data Has patient been taught Hand Expression?: Yes Does the patient have breastfeeding experience prior to this delivery?: Yes  Feeding Feeding Type: Formula  LATCH Score/Interventions                      Lactation Tools Discussed/Used WIC Program: Yes   Consult Status Consult Status: Follow-up Date: 05/21/15 Follow-up type: In-patient    Jannifer RodneyShoptaw, Jana Lynn 05/20/2015, 9:34 PM

## 2015-05-20 NOTE — Transfer of Care (Signed)
Immediate Anesthesia Transfer of Care Note  Patient: [name] Stewart  Procedure(s) Performed: Procedure(s): CESAREAN SECTION MULTI-GESTATIONAL (N/A)  Patient Location: PACU  Anesthesia Type:Spinal  Level of Consciousness: awake, alert  and oriented  Airway & Oxygen Therapy: Patient Spontanous Breathing  Post-op Assessment: Report given to RN and Post -op Vital signs reviewed and stable  Post vital signs: Reviewed and stable  Last Vitals:  Filed Vitals:   05/20/15 0609  BP: 104/90  Pulse: 75  Temp: 36.7 C  Resp: 20    Last Pain: There were no vitals filed for this visit.    Patients Stated Pain Goal: 4 (05/20/15 [id number])  Complications: No apparent anesthesia complications

## 2015-05-20 NOTE — H&P (Signed)
  There has been no change in the history and physical since the original dictation 

## 2015-05-20 NOTE — Anesthesia Postprocedure Evaluation (Signed)
Anesthesia Post Note  Patient: [name] Stewart  Procedure(s) Performed: Procedure(s) (LRB): CESAREAN SECTION MULTI-GESTATIONAL (N/A)  Patient location during evaluation: Mother Baby Anesthesia Type: Spinal Level of consciousness: awake and alert and oriented Pain management: satisfactory to patient Vital Signs Assessment: post-procedure vital signs reviewed and stable Respiratory status: respiratory function stable and spontaneous breathing Cardiovascular status: blood pressure returned to baseline Postop Assessment: no headache, no backache, spinal receding, patient able to bend at knees and adequate PO intake Anesthetic complications: no     Last Vitals:  Filed Vitals:   05/20/15 1040 05/20/15 1440  BP: 150/79 120/51  Pulse: 50 54  Temp: 36.4 C 36.7 C  Resp: 18 18    Last Pain: 2  Pain Goal: Patients Stated Pain Goal: 4 (05/20/15 16100609)               Karleen DolphinFUSSELL,Vale Peraza

## 2015-05-21 LAB — CBC
HEMATOCRIT: 25.3 % — AB (ref 36.0–46.0)
HEMOGLOBIN: 8.7 g/dL — AB (ref 12.0–15.0)
MCH: 31.4 pg (ref 26.0–34.0)
MCHC: 34 g/dL (ref 30.0–36.0)
MCV: 92.3 fL (ref 78.0–100.0)
Platelets: 120 10*3/uL — ABNORMAL LOW (ref 150–400)
RBC: 2.74 MIL/uL — ABNORMAL LOW (ref 3.87–5.11)
RDW: 12.9 % (ref 11.5–15.5)
WBC: 8.3 10*3/uL (ref 4.0–10.5)

## 2015-05-21 MED ORDER — RHO D IMMUNE GLOBULIN 1500 UNIT/2ML IJ SOSY
300.0000 ug | PREFILLED_SYRINGE | Freq: Once | INTRAMUSCULAR | Status: AC
  Administered 2015-05-21: 300 ug via INTRAVENOUS
  Filled 2015-05-21: qty 2

## 2015-05-21 NOTE — Progress Notes (Signed)
I ck on patient's need I ordered her meals, by Orlan LeavensViria Alvarez Spanish Interpreter.

## 2015-05-21 NOTE — Progress Notes (Signed)
Patient ID: [name] Stewart, female   DOB: 04/17/1985, 30 y.o.   MRN: [id number] Postop day 1 Blood pressure [birth year]06/1955 afebrile pulse 72 respiration 16 Abdomen soft dressing dry Lochia moderate Legs negative doing well

## 2015-05-22 LAB — RH IG WORKUP (INCLUDES ABO/RH)
ABO/RH(D): B NEG
FETAL SCREEN: NEGATIVE
Gestational Age(Wks): 38.2
UNIT DIVISION: 0

## 2015-05-22 NOTE — Progress Notes (Signed)
Patient ID: [name] Stewart, female   DOB: 11/08/1985, 30 y.o.   MRN: [id number] Postop day 2 Blood pressure 117/65 pulse 71 respiration 16 Fundus firm Lochia moderate Legs negative doing well

## 2015-05-22 NOTE — Lactation Note (Signed)
This note was copied from a baby's chart. Lactation Consultation Note  Patient Name: Felicity PellegriniGirlB Neah Miley ZOXWR'UToday's Date: 05/22/2015 Reason for consult: Follow-up assessment Babies at 57 hr of life. Mom is bf and formula feeding. She denies breast or nipple pain, voiced no concerns. She declined help with latch at this time because she is going to offer formula. Discussed baby behavior, feeding frequency, supplementing, pumping, baby belly size, breast changes. She is aware of lactation services and support group.    Maternal Data    Feeding Feeding Type: Bottle Fed - Formula Nipple Type: Slow - flow  LATCH Score/Interventions                      Lactation Tools Discussed/Used     Consult Status Consult Status: Follow-up Date: 05/23/15 Follow-up type: In-patient    Rulon Eisenmengerlizabeth E Francie Keeling 05/22/2015, 5:21 PM

## 2015-05-23 LAB — TYPE AND SCREEN
ABO/RH(D): B NEG
Antibody Screen: POSITIVE
DAT, IgG: NEGATIVE
UNIT DIVISION: 0
Unit division: 0

## 2015-05-23 MED ORDER — MEASLES, MUMPS & RUBELLA VAC ~~LOC~~ INJ
0.5000 mL | INJECTION | Freq: Once | SUBCUTANEOUS | Status: AC
  Administered 2015-05-23: 0.5 mL via SUBCUTANEOUS
  Filled 2015-05-23 (×2): qty 0.5

## 2015-05-23 NOTE — Lactation Note (Signed)
This note was copied from a baby's chart. Lactation Consultation Note  Patient Name: [name] Stewart UJWJX'BToday's Date: 05/23/2015 Reason for consult: Follow-up assessment;Other (Comment);Multiple gestation (mom engorged bilaterally , baby A already latched and feeding )  Baby already latched when LC walked in the room , LC assisted with increasing depth . And  breast compressions , breast softened.  LC stressed the importance of feeding the babies 8-12 times a day and offer each twin a breast. LC reassured mom she has plenty of milk  And to establish and protect milk supply is important to keep the milk moving and prevent engorgement.  Sore nipple and engorgement prevention and tx reviewed . LC reviewed and set up of the hand pump. #24 Flange a good fit.  Baby B had 55 ml of formula at 830 and is sound asleep. LC recommended icing the other breast and pumping or relatching baby.  Mother informed of post-discharge support and given phone number to the lactation department, including services for phone call assistance; out-patient appointments; and breastfeeding support group. List of other breastfeeding resources in the community given in the handout. Encouraged mother to call for problems or concerns related to breastfeeding.   Maternal Data Has patient been taught Hand Expression?: Yes  Feeding Feeding Type: Breast Fed Nipple Type: Slow - flow Length of feed: 20 min  LATCH Score/Interventions Latch: Grasps breast easily, tongue down, lips flanged, rhythmical sucking.  Audible Swallowing: Spontaneous and intermittent  Type of Nipple: Everted at rest and after stimulation  Comfort (Breast/Nipple): Filling, red/small blisters or bruises, mild/mod discomfort  Problem noted: Filling Interventions (Filling): Massage  Hold (Positioning): Assistance needed to correctly position infant at breast and maintain latch. Intervention(s): Breastfeeding basics reviewed;Support Pillows;Position  options;Skin to skin  LATCH Score: 8  Lactation Tools Discussed/Used Tools: Pump Breast pump type: Manual Pump Review: Setup, frequency, and cleaning   Consult Status Consult Status: Complete Date: 05/23/15    Kathrin Greathouseorio, Dewell Monnier Ann 05/23/2015, 11:10 AM

## 2015-05-23 NOTE — Progress Notes (Signed)
Patient ID: Marge DuncansYuri Y Leth, female   DOB: 03/12/1985, 30 y.o.   MRN: [id number] Postop day 3 Blood pressure 122/78 pulse 77 respiration 18 Abdomen soft Legs negative Home today

## 2015-05-23 NOTE — Discharge Summary (Signed)
Obstetric Discharge Summary Reason for Admission: cesarean section Prenatal Procedures: none Intrapartum Procedures: cesarean: low cervical, transverse Postpartum Procedures: none Complications-Operative and Postpartum: none HEMOGLOBIN  Date Value Ref Range Status  05/21/2015 8.7* 12.0 - 15.0 g/dL Final    Comment:    DELTA CHECK NOTED REPEATED TO VERIFY    HCT  Date Value Ref Range Status  05/21/2015 25.3* 36.0 - 46.0 % Final    Physical Exam:  General: alert Lochia: appropriate Uterine Fundus: firm Incision: healing well DVT Evaluation: No evidence of DVT seen on physical exam.  Discharge Diagnoses: Term Pregnancy-delivered  Discharge Information: Date: 05/23/2015 Activity: pelvic rest Diet: routine Medications: Percocet Condition: stable Instructions: refer to practice specific booklet Discharge to: home Follow-up Information    Follow up with Kathreen CosierMARSHALL,Santo Zahradnik A, MD.   Specialty:  Obstetrics and Gynecology   Contact information:   [address] [place] [address] [phone]       Newborn Data:   Marthe PatcherezGarcia, BoyA Hilliary [[id number]][[id number]]  Live born female  Birth Weight: 6 lb 13 oz (3090 g) APGAR: 9, 9   Anselm LiserezGarcia, GirlB Lind [[id number]][[id number]]  Live born female  Birth Weight: 6 lb 8.6 oz (2965 g) APGAR: 9, 9  Home with mother.  Alvera Tourigny A 05/23/2015, 6:19 AM

## 2015-05-23 NOTE — Progress Notes (Signed)
Discharge teaching done with Debbora LacrosseViria Interpreter and father of baby.

## 2015-05-23 NOTE — Discharge Instructions (Signed)
Discharge instructions   You can wash your hair  Shower  Eat what you want  Drink what you want  See me in 6 weeks  Your ankles are going to swell more in the next 2 weeks than when pregnant  No sex for 6 weeks   Dayne Chait A, MD 05/23/2015

## 2016-04-10 ENCOUNTER — Observation Stay (HOSPITAL_COMMUNITY)
Admission: EM | Admit: 2016-04-10 | Discharge: 2016-04-11 | Disposition: A | Discharge status: Home / Self Care | Payer: Self-pay | Attending: Surgery | Admitting: Surgery

## 2016-04-10 ENCOUNTER — Emergency Department (HOSPITAL_COMMUNITY): Payer: Self-pay | Admitting: Registered Nurse

## 2016-04-10 ENCOUNTER — Encounter (HOSPITAL_COMMUNITY)
Admission: EM | Disposition: A | Discharge status: Home / Self Care | Payer: Self-pay | Source: Home / Self Care | Attending: Emergency Medicine

## 2016-04-10 ENCOUNTER — Emergency Department (HOSPITAL_COMMUNITY): Payer: Self-pay

## 2016-04-10 ENCOUNTER — Encounter (HOSPITAL_COMMUNITY): Payer: Self-pay | Admitting: Emergency Medicine

## 2016-04-10 DIAGNOSIS — K3580 Unspecified acute appendicitis: Principal | ICD-10-CM | POA: Diagnosis present

## 2016-04-10 DIAGNOSIS — K429 Umbilical hernia without obstruction or gangrene: Secondary | ICD-10-CM | POA: Insufficient documentation

## 2016-04-10 DIAGNOSIS — K660 Peritoneal adhesions (postprocedural) (postinfection): Secondary | ICD-10-CM | POA: Insufficient documentation

## 2016-04-10 HISTORY — PX: LAPAROSCOPIC APPENDECTOMY: SHX408

## 2016-04-10 LAB — COMPREHENSIVE METABOLIC PANEL
ALBUMIN: 4 g/dL (ref 3.5–5.0)
ALK PHOS: 67 U/L (ref 38–126)
ALT: 18 U/L (ref 14–54)
AST: 18 U/L (ref 15–41)
Anion gap: 6 (ref 5–15)
BUN: 14 mg/dL (ref 6–20)
CALCIUM: 8.9 mg/dL (ref 8.9–10.3)
CHLORIDE: 104 mmol/L (ref 101–111)
CO2: 25 mmol/L (ref 22–32)
CREATININE: 0.83 mg/dL (ref 0.44–1.00)
GFR calc non Af Amer: >60 mL/min (ref >60)
Glucose, Bld: 145 mg/dL — ABNORMAL HIGH (ref 65–99)
Potassium: 3.9 mmol/L (ref 3.5–5.1)
SODIUM: 135 mmol/L (ref 135–145)
Total Bilirubin: 1 mg/dL (ref 0.3–1.2)
Total Protein: 7.4 g/dL (ref 6.5–8.1)

## 2016-04-10 LAB — URINALYSIS, ROUTINE W REFLEX MICROSCOPIC
BILIRUBIN URINE: NEGATIVE
GLUCOSE, UA: NEGATIVE mg/dL
Ketones, ur: 80 mg/dL — AB
NITRITE: NEGATIVE
Protein, ur: 30 mg/dL — AB
SPECIFIC GRAVITY, URINE: 1.023 (ref 1.005–1.030)
pH: 6 (ref 5.0–8.0)

## 2016-04-10 LAB — CBC WITH DIFFERENTIAL/PLATELET
BASOS ABS: 0 10*3/uL (ref 0.0–0.1)
Basophils Relative: 0 %
EOS ABS: 0 10*3/uL (ref 0.0–0.7)
Eosinophils Relative: 0 %
HCT: 36.3 % (ref 36.0–46.0)
HEMOGLOBIN: 12.7 g/dL (ref 12.0–15.0)
LYMPHS ABS: 1.1 10*3/uL (ref 0.7–4.0)
Lymphocytes Relative: 7 %
MCH: 31.1 pg (ref 26.0–34.0)
MCHC: 35 g/dL (ref 30.0–36.0)
MCV: 89 fL (ref 78.0–100.0)
Monocytes Absolute: 1.1 10*3/uL — ABNORMAL HIGH (ref 0.1–1.0)
Monocytes Relative: 7 %
Neutro Abs: 14 10*3/uL — ABNORMAL HIGH (ref 1.7–7.7)
Neutrophils Relative %: 86 %
Platelets: 267 10*3/uL (ref 150–400)
RBC: 4.08 MIL/uL (ref 3.87–5.11)
RDW: 12.4 % (ref 11.5–15.5)
WBC: 16.2 10*3/uL — ABNORMAL HIGH (ref 4.0–10.5)

## 2016-04-10 LAB — PREGNANCY, URINE: Preg Test, Ur: NEGATIVE

## 2016-04-10 LAB — SURGICAL PCR SCREEN
MRSA, PCR: NEGATIVE
STAPHYLOCOCCUS AUREUS: NEGATIVE

## 2016-04-10 SURGERY — APPENDECTOMY, LAPAROSCOPIC
Anesthesia: General

## 2016-04-10 MED ORDER — HYDROMORPHONE HCL 1 MG/ML IJ SOLN
INTRAMUSCULAR | Status: AC
  Filled 2016-04-10: qty 1

## 2016-04-10 MED ORDER — LACTATED RINGERS IR SOLN
Status: DC | PRN
  Administered 2016-04-10: 1000 mL

## 2016-04-10 MED ORDER — HYDROMORPHONE HCL 2 MG/ML IJ SOLN
INTRAMUSCULAR | Status: AC
  Filled 2016-04-10: qty 1

## 2016-04-10 MED ORDER — FENTANYL CITRATE (PF) 100 MCG/2ML IJ SOLN
INTRAMUSCULAR | Status: DC | PRN
  Administered 2016-04-10 (×2): 50 ug via INTRAVENOUS
  Administered 2016-04-10: 100 ug via INTRAVENOUS
  Administered 2016-04-10: 50 ug via INTRAVENOUS

## 2016-04-10 MED ORDER — METOCLOPRAMIDE HCL 5 MG/ML IJ SOLN: 10.0000 mg | Freq: Once | INTRAMUSCULAR | Status: DC | PRN

## 2016-04-10 MED ORDER — HEPARIN SODIUM (PORCINE) 5000 UNIT/ML IJ SOLN
5000.0000 [IU] | Freq: Three times a day (TID) | INTRAMUSCULAR | Status: DC
  Administered 2016-04-11: 5000 [IU] via SUBCUTANEOUS
  Filled 2016-04-10: qty 1

## 2016-04-10 MED ORDER — LACTATED RINGERS IV SOLN
INTRAVENOUS | Status: DC
Start: 2016-04-10 — End: 2016-04-10

## 2016-04-10 MED ORDER — MORPHINE SULFATE (PF) 2 MG/ML IV SOLN: 1.0000 mg | INTRAVENOUS | Status: DC | PRN

## 2016-04-10 MED ORDER — DEXAMETHASONE SODIUM PHOSPHATE 10 MG/ML IJ SOLN
INTRAMUSCULAR | Status: AC
  Filled 2016-04-10: qty 1

## 2016-04-10 MED ORDER — HYDROMORPHONE HCL 1 MG/ML IJ SOLN
0.2500 mg | INTRAMUSCULAR | Status: DC | PRN
  Administered 2016-04-10: 0.5 mg via INTRAVENOUS

## 2016-04-10 MED ORDER — LACTATED RINGERS IV SOLN
INTRAVENOUS | Status: DC | PRN
  Administered 2016-04-10: 20:00:00 via INTRAVENOUS

## 2016-04-10 MED ORDER — ROCURONIUM BROMIDE 50 MG/5ML IV SOSY
PREFILLED_SYRINGE | INTRAVENOUS | Status: AC
  Filled 2016-04-10: qty 5

## 2016-04-10 MED ORDER — ONDANSETRON HCL 4 MG/2ML IJ SOLN
INTRAMUSCULAR | Status: AC
  Filled 2016-04-10: qty 2

## 2016-04-10 MED ORDER — IBUPROFEN 200 MG PO TABS: 400.0000 mg | ORAL_TABLET | Freq: Four times a day (QID) | ORAL | Status: DC | PRN

## 2016-04-10 MED ORDER — METRONIDAZOLE IN NACL 5-0.79 MG/ML-% IV SOLN
500.0000 mg | Freq: Once | INTRAVENOUS | Status: AC
  Administered 2016-04-10: 500 mg via INTRAVENOUS
  Filled 2016-04-10: qty 100

## 2016-04-10 MED ORDER — PHENYLEPHRINE 40 MCG/ML (10ML) SYRINGE FOR IV PUSH (FOR BLOOD PRESSURE SUPPORT)
PREFILLED_SYRINGE | INTRAVENOUS | Status: DC | PRN
  Administered 2016-04-10: 40 ug via INTRAVENOUS
  Administered 2016-04-10: 80 ug via INTRAVENOUS
  Administered 2016-04-10 (×2): 40 ug via INTRAVENOUS

## 2016-04-10 MED ORDER — MIDAZOLAM HCL 5 MG/5ML IJ SOLN
INTRAMUSCULAR | Status: DC | PRN
  Administered 2016-04-10: 2 mg via INTRAVENOUS

## 2016-04-10 MED ORDER — LIDOCAINE 2% (20 MG/ML) 5 ML SYRINGE
INTRAMUSCULAR | Status: AC
  Filled 2016-04-10: qty 5

## 2016-04-10 MED ORDER — FENTANYL CITRATE (PF) 250 MCG/5ML IJ SOLN
INTRAMUSCULAR | Status: AC
  Filled 2016-04-10: qty 5

## 2016-04-10 MED ORDER — SUCCINYLCHOLINE CHLORIDE 200 MG/10ML IV SOSY
PREFILLED_SYRINGE | INTRAVENOUS | Status: DC | PRN
  Administered 2016-04-10: 140 mg via INTRAVENOUS

## 2016-04-10 MED ORDER — ONDANSETRON HCL 4 MG/2ML IJ SOLN: 4.0000 mg | Freq: Four times a day (QID) | INTRAMUSCULAR | Status: DC | PRN

## 2016-04-10 MED ORDER — HYDROCODONE-ACETAMINOPHEN 5-325 MG PO TABS
1.0000 | ORAL_TABLET | ORAL | Status: DC | PRN
  Administered 2016-04-11: 1 via ORAL
  Filled 2016-04-10: qty 1

## 2016-04-10 MED ORDER — SUGAMMADEX SODIUM 200 MG/2ML IV SOLN
INTRAVENOUS | Status: AC
  Filled 2016-04-10: qty 2

## 2016-04-10 MED ORDER — DEXAMETHASONE SODIUM PHOSPHATE 10 MG/ML IJ SOLN
INTRAMUSCULAR | Status: DC | PRN
  Administered 2016-04-10: 8 mg via INTRAVENOUS

## 2016-04-10 MED ORDER — SUCCINYLCHOLINE CHLORIDE 200 MG/10ML IV SOSY
PREFILLED_SYRINGE | INTRAVENOUS | Status: AC
  Filled 2016-04-10: qty 10

## 2016-04-10 MED ORDER — SODIUM CHLORIDE 0.9 % IR SOLN
Status: DC | PRN
  Administered 2016-04-10: 1000 mL

## 2016-04-10 MED ORDER — HYDROCODONE-ACETAMINOPHEN 7.5-325 MG PO TABS: 1.0000 | ORAL_TABLET | Freq: Once | ORAL | Status: DC | PRN

## 2016-04-10 MED ORDER — SODIUM CHLORIDE 0.9 % IV SOLN
INTRAVENOUS | Status: DC | PRN
  Administered 2016-04-10: 19:00:00 via INTRAVENOUS

## 2016-04-10 MED ORDER — ONDANSETRON HCL 4 MG/2ML IJ SOLN
INTRAMUSCULAR | Status: DC | PRN
  Administered 2016-04-10: 4 mg via INTRAVENOUS

## 2016-04-10 MED ORDER — SODIUM CHLORIDE 0.9 % IV BOLUS (SEPSIS)
1000.0000 mL | Freq: Once | INTRAVENOUS | Status: AC
  Administered 2016-04-10: 1000 mL via INTRAVENOUS

## 2016-04-10 MED ORDER — IOPAMIDOL (ISOVUE-300) INJECTION 61%
INTRAVENOUS | Status: AC
  Filled 2016-04-10: qty 100

## 2016-04-10 MED ORDER — MEPERIDINE HCL 50 MG/ML IJ SOLN: 6.2500 mg | INTRAMUSCULAR | Status: DC | PRN

## 2016-04-10 MED ORDER — BUPIVACAINE HCL (PF) 0.25 % IJ SOLN
INTRAMUSCULAR | Status: AC
  Filled 2016-04-10: qty 30

## 2016-04-10 MED ORDER — BUPIVACAINE-EPINEPHRINE 0.25% -1:200000 IJ SOLN
INTRAMUSCULAR | Status: DC | PRN
  Administered 2016-04-10: 30 mL

## 2016-04-10 MED ORDER — ROCURONIUM BROMIDE 10 MG/ML (PF) SYRINGE
PREFILLED_SYRINGE | INTRAVENOUS | Status: DC | PRN
Start: 2016-04-10 — End: 2016-04-10
  Administered 2016-04-10: 10 mg via INTRAVENOUS
  Administered 2016-04-10: 5 mg via INTRAVENOUS
  Administered 2016-04-10: 35 mg via INTRAVENOUS

## 2016-04-10 MED ORDER — ONDANSETRON 4 MG PO TBDP: 4.0000 mg | ORAL_TABLET | Freq: Four times a day (QID) | ORAL | Status: DC | PRN

## 2016-04-10 MED ORDER — IOPAMIDOL (ISOVUE-300) INJECTION 61%
INTRAVENOUS | Status: AC
  Administered 2016-04-10: 100 mL via INTRAVENOUS
  Filled 2016-04-10: qty 100

## 2016-04-10 MED ORDER — PROPOFOL 10 MG/ML IV BOLUS
INTRAVENOUS | Status: AC
  Filled 2016-04-10: qty 20

## 2016-04-10 MED ORDER — SUGAMMADEX SODIUM 200 MG/2ML IV SOLN
INTRAVENOUS | Status: DC | PRN
  Administered 2016-04-10: 140 mg via INTRAVENOUS

## 2016-04-10 MED ORDER — CEFTRIAXONE SODIUM 2 G IJ SOLR
2.0000 g | Freq: Once | INTRAMUSCULAR | Status: AC
  Administered 2016-04-10: 2 g via INTRAVENOUS
  Filled 2016-04-10: qty 2

## 2016-04-10 MED ORDER — KCL IN DEXTROSE-NACL 20-5-0.45 MEQ/L-%-% IV SOLN
INTRAVENOUS | Status: DC
  Administered 2016-04-10: 23:00:00 via INTRAVENOUS
  Filled 2016-04-10 (×2): qty 1000

## 2016-04-10 MED ORDER — MIDAZOLAM HCL 2 MG/2ML IJ SOLN
INTRAMUSCULAR | Status: AC
  Filled 2016-04-10: qty 2

## 2016-04-10 MED ORDER — LIDOCAINE 2% (20 MG/ML) 5 ML SYRINGE
INTRAMUSCULAR | Status: DC | PRN
  Administered 2016-04-10: 50 mg via INTRAVENOUS

## 2016-04-10 MED ORDER — PIPERACILLIN-TAZOBACTAM 3.375 G IVPB
3.3750 g | Freq: Three times a day (TID) | INTRAVENOUS | Status: DC
  Administered 2016-04-10 – 2016-04-11 (×2): 3.375 g via INTRAVENOUS
  Filled 2016-04-10 (×4): qty 50

## 2016-04-10 MED ORDER — HYDROMORPHONE HCL 1 MG/ML IJ SOLN
INTRAMUSCULAR | Status: DC | PRN
  Administered 2016-04-10: 0.5 mg via INTRAVENOUS

## 2016-04-10 MED ORDER — PROPOFOL 10 MG/ML IV BOLUS
INTRAVENOUS | Status: DC | PRN
  Administered 2016-04-10: 120 mg via INTRAVENOUS

## 2016-04-10 SURGICAL SUPPLY — 42 items
ADH SKN CLS APL DERMABOND .7 (GAUZE/BANDAGES/DRESSINGS) ×1
APL SKNCLS STERI-STRIP NONHPOA (GAUZE/BANDAGES/DRESSINGS)
APPLIER CLIP ROT 10 11.4 M/L (STAPLE)
APR CLP MED LRG 11.4X10 (STAPLE)
BAG SPEC RTRVL LRG 6X4 10 (ENDOMECHANICALS) ×1
BENZOIN TINCTURE PRP APPL 2/3 (GAUZE/BANDAGES/DRESSINGS) IMPLANT
CABLE HIGH FREQUENCY MONO STRZ (ELECTRODE) ×3 IMPLANT
CHLORAPREP W/TINT 26ML (MISCELLANEOUS) ×3 IMPLANT
CLIP APPLIE ROT 10 11.4 M/L (STAPLE) IMPLANT
CLOSURE WOUND 1/2 X4 (GAUZE/BANDAGES/DRESSINGS)
COVER SURGICAL LIGHT HANDLE (MISCELLANEOUS) ×3 IMPLANT
CUTTER FLEX LINEAR 45M (STAPLE) ×2 IMPLANT
DECANTER SPIKE VIAL GLASS SM (MISCELLANEOUS) ×3 IMPLANT
DERMABOND ADVANCED (GAUZE/BANDAGES/DRESSINGS) ×2
DERMABOND ADVANCED .7 DNX12 (GAUZE/BANDAGES/DRESSINGS) ×1 IMPLANT
DRAPE LAPAROSCOPIC ABDOMINAL (DRAPES) ×3 IMPLANT
ELECT REM PT RETURN 15FT ADLT (MISCELLANEOUS) ×3 IMPLANT
ENDOLOOP SUT PDS II  0 18 (SUTURE)
ENDOLOOP SUT PDS II 0 18 (SUTURE) IMPLANT
GLOVE SURG SIGNA 7.5 PF LTX (GLOVE) ×3 IMPLANT
GOWN STRL REUS W/TWL XL LVL3 (GOWN DISPOSABLE) ×6 IMPLANT
IRRIG SUCT STRYKERFLOW 2 WTIP (MISCELLANEOUS) ×3
IRRIGATION SUCT STRKRFLW 2 WTP (MISCELLANEOUS) ×1 IMPLANT
KIT BASIN OR (CUSTOM PROCEDURE TRAY) ×3 IMPLANT
POUCH SPECIMEN RETRIEVAL 10MM (ENDOMECHANICALS) ×3 IMPLANT
RELOAD 45 VASCULAR/THIN (ENDOMECHANICALS) IMPLANT
RELOAD STAPLE 45 2.5 WHT GRN (ENDOMECHANICALS) IMPLANT
RELOAD STAPLE 45 3.5 BLU ETS (ENDOMECHANICALS) IMPLANT
RELOAD STAPLE TA45 3.5 REG BLU (ENDOMECHANICALS) ×3 IMPLANT
SCISSORS LAP 5X35 DISP (ENDOMECHANICALS) ×3 IMPLANT
SHEARS HARMONIC ACE PLUS 36CM (ENDOMECHANICALS) ×3 IMPLANT
SLEEVE XCEL OPT CAN 5 100 (ENDOMECHANICALS) ×3 IMPLANT
STRIP CLOSURE SKIN 1/2X4 (GAUZE/BANDAGES/DRESSINGS) IMPLANT
SUT MNCRL AB 4-0 PS2 18 (SUTURE) ×3 IMPLANT
SUT VIC AB 2-0 SH 18 (SUTURE) IMPLANT
TOWEL OR 17X26 10 PK STRL BLUE (TOWEL DISPOSABLE) ×3 IMPLANT
TOWEL OR NON WOVEN STRL DISP B (DISPOSABLE) ×3 IMPLANT
TRAY FOLEY CATH 14FRSI W/METER (CATHETERS) ×2 IMPLANT
TRAY LAPAROSCOPIC (CUSTOM PROCEDURE TRAY) ×3 IMPLANT
TROCAR BLADELESS OPT 5 100 (ENDOMECHANICALS) ×3 IMPLANT
TROCAR XCEL 12X100 BLDLESS (ENDOMECHANICALS) ×2 IMPLANT
TROCAR XCEL BLUNT TIP 100MML (ENDOMECHANICALS) ×3 IMPLANT

## 2016-04-10 NOTE — Op Note (Signed)
Re:   [name] Stewart DOB:   [birth year] MRN:   [id number]                   FACILITY:  [facility]  DATE OF PROCEDURE: 04/10/2016                              OPERATIVE REPORT  PREOPERATIVE DIAGNOSIS:  Appendicitis  POSTOPERATIVE DIAGNOSIS:  Acute purulent gangrenous appendicitis.  Omental adhesions to the midline from the umbilicus to the pelvis.  PROCEDURE:  Laparoscopic appendectomy.  Enterolysis of adhesions (10 minutes)  SURGEON:  Sandria Bales. Ezzard Standing, MD  ASSISTANT:  No first assistant.  ANESTHESIA:  General endotracheal.  Anesthesiologist: Mal Amabile, MD CRNA: Paris Lore, CRNA  ASA:  2E  ESTIMATED BLOOD LOSS:  Minimal.  DRAINS: none   SPECIMEN:   Appendix  COUNTS CORRECT:  YES  INDICATIONS FOR PROCEDURE: [name] Stewart is a 31 y.o. (DOB: 1985/01/08) Hispanic female whose primary care doctor is MARSHALL,BERNARD A, MD (Inactive) and comes to the OR for an appendectomy.   I discussed with the patient, the indications and potential complications of appendiceal surgery.  The potential complications include, but are not limited to, bleeding, open surgery, bowel resection, and the possibility of another diagnosis.  OPERATIVE NOTE:  The patient underwent a general endotracheal anesthetic as supervised by Anesthesiologist: Mal Amabile, MD CRNA: Paris Lore, CRNA, General, in room #1 at [facility] OR.  The patient was Flagyl and Rocephin prior to the beginning of the procedure and the abdomen was prepped with ChloraPrep.   A time-out was held and surgical checklist run.  An infraumbilical incision was made with sharp dissection carried down to the abdominal cavity.  An 12 mm Hasson trocar was inserted through the infraumbilical incision and into the peritoneal cavity.  A 0 degree 5 mm laparoscope was inserted through a 12 mm Hasson trocar and the Hasson trocar secured with a 0 Vicryl suture.  I placed a 11 mm trocar in the right upper quadrant (I did this for the angle of the  staples across the base of the appendix) and 5 mm torcar in left lower quadrant and did abdominal exploration.    The right and left lobes of liver unremarkable.  Stomach was unremarkable.  The pelvic organs were unremarkable.  She had omentum adhered to the anterior peritoneum from the umbilicus towards the pelvis.  I spent 10 minutes doing enterolysis of these adhesions.   I saw no other intra-abdominal abnormality.  The patient had appendicitis with the appendix located at the right pelvic brim.  It was encased in omentum - was purulent and gangrenous.  There was pus around the appendix, but no clear perforation.  The mesentery of the appendix was divided with a Harmonic scalpel.  I got to the base of the appendix.  I then used a blue load 45 mm Ethicon Endo-GIA stapler and fired this across the base of the appendix.  I placed the appendix in EndoCatch bag and delivered the bag through the umbilical incision.  I irrigated the abdomen with 1,000 cc of saline.  After irrigating the abdomen, I then removed the trocars, in turn.  The umbilical port fascia was closed with 0 Vicryl suture.   I closed the skin each site with a 4-0 Monocryl suture and painted the wounds with DermaBond.  I then injected a total of 25 mL of 0.25% Marcaine at the incisions.  Sponge and needle count were correct at the end of the case.  The patient was transferred to the recovery room in good condition.  The foley was removed prior to the beginning of the operation.  The patient tolerated the procedure well and it depends on the patient's post op clinical course as to when the patient could be discharged.   Ovidio Kin, MD, [facility] Surgery Pager: [phone] Office phone:  [phone]

## 2016-04-10 NOTE — Anesthesia Preprocedure Evaluation (Signed)
Anesthesia Evaluation  Patient identified by MRN, date of birth, ID band Patient awake    Reviewed: Allergy & Precautions, NPO status , Patient's Chart, lab work & pertinent test results  Airway Mallampati: II       Dental no notable dental hx. (+) Teeth Intact   Pulmonary neg pulmonary ROS,    Pulmonary exam normal breath sounds clear to auscultation       Cardiovascular negative cardio ROS Normal cardiovascular exam Rhythm:Regular Rate:Normal     Neuro/Psych negative neurological ROS  negative psych ROS   GI/Hepatic Neg liver ROS, Acute appendicitis   Endo/Other  negative endocrine ROS  Renal/GU negative Renal ROS  negative genitourinary   Musculoskeletal negative musculoskeletal ROS (+)   Abdominal (+) - obese,  Abdomen: tender.    Peds  Hematology negative hematology ROS (+)   Anesthesia Other Findings   Reproductive/Obstetrics                             Anesthesia Physical Anesthesia Plan  ASA: II and emergent  Anesthesia Plan: General   Post-op Pain Management:    Induction: Intravenous, Rapid sequence and Cricoid pressure planned  Airway Management Planned: Oral ETT  Additional Equipment:   Intra-op Plan:   Post-operative Plan: Extubation in OR  Informed Consent: I have reviewed the patients History and Physical, chart, labs and discussed the procedure including the risks, benefits and alternatives for the proposed anesthesia with the patient or authorized representative who has indicated his/her understanding and acceptance.   Dental advisory given  Plan Discussed with: Anesthesiologist, CRNA and Surgeon  Anesthesia Plan Comments:         Anesthesia Quick Evaluation

## 2016-04-10 NOTE — H&P (Signed)
Re:   [name] Stewart DOB:   1985/05/31 MRN:   [id number]  Chief Complaint Abdominal Pain  ASSESEMENT AND PLAN: 1.  Appendicitis  I discussed with the patient the indications and risks of appendiceal surgery.  The primary risks of appendiceal surgery include, but are not limited to, bleeding, infection, bowel surgery, and open surgery.  There is also the risk that the patient may have continued symptoms after surgery.  We discussed the typical post-operative recovery course. I tried to answer the patient's questions.  2.  Umbilical hernia 3.  Multiple pregnancies   Chief Complaint  Patient presents with  . Nausea  . Emesis  . Abdominal Pain   PHYSICIAN REQUESTING CONSULTATION: Trixie Dredge, PA, [place]  HISTORY OF PRESENT ILLNESS: [name] Stewart is Stewart 31 y.o. (DOB: 07-Nov-1985)  hispanic female whose primary care physician is [name] A, MD (Inactive) and comes to [facility] today for abdominal pain, nausea, and vomiting. Her husband, [name] Stewart, is in the room with her. I used the translator screen to discuss the surgery with the patient.  She developed pain on Thursday, 04/08/2016.  The pain has become steadily worse and localized to the RLQ.  She has no chronic GI disease.  She has no stomach, liver, or colon problems. She has had 2 C section.  The abdominal CT scan 04/10/2016 shows 1) evidence of moderate acute appendicitis with 2.3 cm appendicolith at the origin of the appendix. No perforation or abscess.  2) Small umbilical hernia. CBC - 04/10/2016 - 16,200   History reviewed. No pertinent past medical history.     Past Surgical History:  Procedure Laterality Date  . CESAREAN SECTION    . CESAREAN SECTION MULTI-GESTATIONAL N/Stewart 05/20/2015   Procedure: CESAREAN SECTION MULTI-GESTATIONAL;  Surgeon: Kathreen Cosier, MD;  Location: [facility] BIRTHING SUITES;  Service: Obstetrics;  Laterality: N/Stewart;      Current Facility-Administered Medications  Medication Dose Route Frequency  Provider Last Rate Last Dose  . iopamidol (ISOVUE-300) 61 % injection            Current Outpatient Prescriptions  Medication Sig Dispense Refill  . ibuprofen (ADVIL,MOTRIN) 200 MG tablet Take 400 mg by mouth every 6 (six) hours as needed.       No Known Allergies  REVIEW OF SYSTEMS: Skin:  No history of rash.   Infection:  No history of hepatitis or HIV.  No history of MRSA. Neurologic:  No history of stroke.  No history of seizure.  No history of headaches. Cardiac:  No history of hypertension. No history of heart disease.   Pulmonary:  Does not smoke cigarettes.  No asthma or bronchitis.  No OSA/CPAP.  Endocrine:  No diabetes. No thyroid disease. Gastrointestinal:  See HPI Urologic:  No history of kidney stones.  No history of bladder infections. Musculoskeletal:  No history of joint or back disease. Hematologic:  No bleeding disorder.  No history of anemia.  Not anticoagulated. Psycho-social:  The patient is oriented.   The patient has no obvious psychologic or social impairment to understanding our conversation and plan.  SOCIAL and FAMILY HISTORY: Married. Limited English skills - used translator screen Twins -  boy and girl born May 2017 She has 6 children - 2 sets of twins  No family history of appendicitis.  PHYSICAL EXAM: BP 109/63 (BP Location: Right Arm)   Pulse 93   Temp 98.9 F (37.2 C) (Oral)   Resp 19   Ht  (1.626 m)   Wt  70.3 kg (155 lb)   LMP 03/20/2016   SpO2 99%   Breastfeeding? No   BMI 26.61 kg/m   General: WN Hispanic F who is alert and generally healthy appearing.  Skin:  Inspection and palpation - no mass or rash. Eyes:  Conjunctiva and lids unremarkable.            Pupils are equal Ears, Nose, Mouth, and Throat:  Ears and nose unremarkable            Lips and teeth are unremarable. Neck: Supple. No mass, trachea midline.  No thyroid mass. Lymph Nodes:  No supraclavicular, cervical, or inguinal nodes. Lungs: Normal respiratory  effort.  Clear to auscultation and symmetric breath sounds. Heart:  Palpation of the heart is normal.            Auscultation: RRR. No murmur or rub.  Abdomen: Soft. No mass. No tenderness. No hernia.             Liver:  Do not feel.            RLQ tenderness, but no peritoneal signs Rectal: Not done. Musculoskeletal:              Good muscle strength and ROM  in upper and lower extremities. Neurologic:  Grossly intact to motor and sensory function. Psychiatric: Normal judgement and insight. Behavior is normal.            Oriented to time, person, place.   DATA REVIEWED, COUNSELING AND COORDINATION OF CARE: Epic notes reviewed. Counseling and coordination of care exceeded more than 50% of the time spent with patient. Total time spent with patient and charting: 45 minutes  Ovidio Kin, MD,  [facility] Surgery, [place] [address]  [address]   [place], [place]    [address] Phone:  [phone] FAX:  [phone]

## 2016-04-10 NOTE — Anesthesia Postprocedure Evaluation (Signed)
Anesthesia Post Note  Patient: Eulogio Ditch Hudler  Procedure(s) Performed: Procedure(s) (LRB): APPENDECTOMY LAPAROSCOPIC (N/A)  Patient location during evaluation: PACU Anesthesia Type: General Level of consciousness: awake and alert Pain management: pain level controlled Vital Signs Assessment: post-procedure vital signs reviewed and stable Respiratory status: spontaneous breathing, nonlabored ventilation and respiratory function stable Cardiovascular status: blood pressure returned to baseline and stable Postop Assessment: no signs of nausea or vomiting Anesthetic complications: no       Last Vitals:  Vitals:   04/10/16 2050 04/10/16 2100  BP: 122/66 120/63  Pulse: 84   Resp: 14   Temp: 37.4 C     Last Pain:  Vitals:   04/10/16 2100  TempSrc:   PainSc: 0-No pain                 Henry Utsey A.

## 2016-04-10 NOTE — Anesthesia Procedure Notes (Signed)
Procedure Name: Intubation Date/Time: 04/10/2016 7:25 PM Performed by: Anne Fu Pre-anesthesia Checklist: Patient identified, Emergency Drugs available, Suction available, Patient being monitored and Timeout performed Patient Re-evaluated:Patient Re-evaluated prior to inductionOxygen Delivery Method: Circle system utilized Preoxygenation: Pre-oxygenation with 100% oxygen Intubation Type: IV induction, Cricoid Pressure applied and Rapid sequence Laryngoscope Size: Mac and 4 Grade View: Grade I Tube type: Oral Tube size: 7.5 mm Number of attempts: 1 Airway Equipment and Method: Stylet Placement Confirmation: ETT inserted through vocal cords under direct vision,  positive ETCO2,  CO2 detector and breath sounds checked- equal and bilateral Secured at: 21 cm Tube secured with: Tape Dental Injury: Teeth and Oropharynx as per pre-operative assessment

## 2016-04-10 NOTE — Transfer of Care (Signed)
Immediate Anesthesia Transfer of Care Note  Patient: [name] Stewart  Procedure(s) Performed: Procedure(s): APPENDECTOMY LAPAROSCOPIC (N/A)  Patient Location: PACU  Anesthesia Type:General  Level of Consciousness:  sedated, patient cooperative and responds to stimulation  Airway & Oxygen Therapy:Patient Spontanous Breathing and Patient connected to face mask oxgen  Post-op Assessment:  Report given to PACU RN and Post -op Vital signs reviewed and stable  Post vital signs:  Reviewed and stable  Last Vitals:  Vitals:   04/10/16 2049 04/10/16 2050  BP:  (P) 122/66  Pulse:  (P) 84  Resp: (P) 14 (P) 14  Temp:  (P) 37.4 C    Complications: No apparent anesthesia complications

## 2016-04-10 NOTE — ED Triage Notes (Signed)
Pt and husband report nausea, vomiting and r/lower abdominal pain x 3 days. Last vomited yesterday. Pt indicated sharp r/groin pain.Last period 2 weeks ago. Pt is alert, oriented and ambulatory. ESL-Spanish, speaks some [place]

## 2016-04-10 NOTE — ED Provider Notes (Signed)
[facility]-EMERGENCY DEPT Provider Note   CSN: [id number] Arrival date & time: 04/10/16  1147     History   Chief Complaint Chief Complaint  Patient presents with  . Nausea  . Emesis  . Abdominal Pain    HPI [name] Stewart is a 31 y.o. female.  The history is provided by the patient.     Pt p/w RLQ abdominal pain, N/V, decreased appetite that began two days ago.  Did have pain with urination yesterday but not today.  Noticed blood trickling down her leg yesterday morning she thought was from her vagina.  Associated subjective fevers, dry mouth.  Last BM was yesterday morning.  Denies abnormal vaginal discharge other than blood yesterday.  LMP two weeks ago.  Has hx abdominal surgery- c-section only.  Has taken pepto bismol and advil without improvement.  Family members have been sick with N/V/D, abdominal pain recently.  Pt has had more persistent pain and has never had diarrhea.    History reviewed. No pertinent past medical history.  Patient Active Problem List   Diagnosis Date Noted  . Acute appendicitis 04/10/2016  . S/P cesarean section 05/20/2015    Past Surgical History:  Procedure Laterality Date  . CESAREAN SECTION    . CESAREAN SECTION MULTI-GESTATIONAL N/A 05/20/2015   Procedure: CESAREAN SECTION MULTI-GESTATIONAL;  Surgeon: Kathreen Cosier, MD;  Location: [facility] BIRTHING SUITES;  Service: Obstetrics;  Laterality: N/A;    OB History    Gravida Para Term Preterm AB Living   SAB TAB Ectopic Multiple Live Births         2 6       Home Medications    Prior to Admission medications   Medication Sig Start Date End Date Taking? Authorizing Provider  ibuprofen (ADVIL,MOTRIN) 200 MG tablet Take 400 mg by mouth every 6 (six) hours as needed.   Yes Historical Provider, MD    Family History Family History  Problem Relation Age of Onset  . Alcohol abuse Neg Hx   . Arthritis Neg Hx   . Asthma Neg Hx   . Birth defects Neg Hx   . Cancer Neg Hx   .  COPD Neg Hx   . Depression Neg Hx   . Diabetes Neg Hx   . Drug abuse Neg Hx   . Early death Neg Hx   . Hearing loss Neg Hx   . Heart disease Neg Hx   . Hyperlipidemia Neg Hx   . Hypertension Neg Hx   . Kidney disease Neg Hx   . Learning disabilities Neg Hx   . Mental illness Neg Hx   . Mental retardation Neg Hx   . Miscarriages / Stillbirths Neg Hx   . Stroke Neg Hx   . Vision loss Neg Hx   . Varicose Veins Neg Hx     Social History Social History  Substance Use Topics  . Smoking status: Never Smoker  . Smokeless tobacco: Never Used  . Alcohol use No     Allergies   Patient has no known allergies.   Review of Systems Review of Systems  All other systems reviewed and are negative.    Physical Exam Updated Vital Signs BP 120/63 (BP Location: Left Arm)   Pulse 84   Temp 100.3 F (37.9 C)   Resp 14   Ht  (1.626 m)   Wt 70.3 kg   LMP 03/20/2016   SpO2 100%  Breastfeeding? No   BMI 26.61 kg/m   Physical Exam  Constitutional: She appears well-developed and well-nourished. No distress.  HENT:  Head: Normocephalic and atraumatic.  Neck: Neck supple.  Cardiovascular: Normal rate and regular rhythm.   Pulmonary/Chest: Effort normal and breath sounds normal. No respiratory distress. She has no wheezes. She has no rales.  Abdominal: Soft. She exhibits no distension. There is tenderness (RLQ). There is no rebound, no guarding and no CVA tenderness.  Neurological: She is alert.  Skin: She is not diaphoretic.  Nursing note and vitals reviewed.    ED Treatments / Results  Labs (all labs ordered are listed, but only abnormal results are displayed) Labs Reviewed  CBC WITH DIFFERENTIAL/PLATELET - Abnormal; Notable for the following:       Result Value   WBC 16.2 (*)    Neutro Abs 14.0 (*)    Monocytes Absolute 1.1 (*)    All other components within normal limits  COMPREHENSIVE METABOLIC PANEL - Abnormal; Notable for the following:    Glucose, Bld 145  (*)    All other components within normal limits  URINALYSIS, ROUTINE W REFLEX MICROSCOPIC - Abnormal; Notable for the following:    APPearance HAZY (*)    Hgb urine dipstick MODERATE (*)    Ketones, ur 80 (*)    Protein, ur 30 (*)    Leukocytes, UA SMALL (*)    Bacteria, UA RARE (*)    Squamous Epithelial / LPF 6-30 (*)    All other components within normal limits  SURGICAL PCR SCREEN  PREGNANCY, URINE  SURGICAL PATHOLOGY    EKG  EKG Interpretation None       Radiology Ct Abdomen Pelvis W Contrast  Result Date: 04/10/2016 CLINICAL DATA:  Nausea, vomiting and right lower abdominal pain 3 days. Right groin pain. EXAM: CT ABDOMEN AND PELVIS WITH CONTRAST TECHNIQUE: Multidetector CT imaging of the abdomen and pelvis was performed using the standard protocol following bolus administration of intravenous contrast. CONTRAST:  1 ISOVUE-300 IOPAMIDOL (ISOVUE-300) INJECTION 61% COMPARISON:  None. FINDINGS: Lower chest: Lung bases are within normal. Hepatobiliary: Normal. Pancreas: Normal. Spleen: Normal. Adrenals/Urinary Tract: Adrenal glands are normal. Kidneys are normal in size without hydronephrosis or nephrolithiasis. Ureters and bladder are normal. Stomach/Bowel: Stomach and small bowel are within normal. Appendix is severely inflamed measuring 2 cm in diameter with adjacent inflammatory change and small amount of adjacent free fluid. There is an oval density rim calcified 2.3 cm structure at the origin of the appendix likely an appendicolith. No definite perforation or periappendiceal abscess. Colon is within normal. Vascular/Lymphatic: Vascular structures are within normal. No evidence of adenopathy. Reproductive: Within normal. Other: Mild to moderate amount of free pelvic fluid. Small umbilical hernia with diastases of the rectus abdominus muscles at the umbilicus and a few bowel loops extending into this region. Musculoskeletal: Within normal. IMPRESSION: Evidence of moderate acute  appendicitis with 2.3 cm appendicolith at the origin of the appendix. No perforation or abscess. Small umbilical hernia. Critical Value/emergent results were called by telephone at the time of interpretation on 04/10/2016 at 2:37 pm to Dr. Trixie Dredge , who verbally acknowledged these results. Electronically Signed   By: Elberta Fortis M.D.   On: 04/10/2016 14:38    Procedures Procedures (including critical care time)  Medications Ordered in ED Medications  iopamidol (ISOVUE-300) 61 % injection (not administered)  HYDROmorphone (DILAUDID) injection 0.25-0.5 mg (0.5 mg Intravenous Given 04/10/16 2117)  HYDROcodone-acetaminophen (NORCO) 7.5-325 MG per tablet 1 tablet (not administered)  meperidine (DEMEROL) injection 6.25-12.5 mg (not administered)  metoCLOPramide (REGLAN) injection 10 mg (not administered)  lactated ringers infusion (not administered)  HYDROmorphone (DILAUDID) 1 MG/ML injection (not administered)  sodium chloride 0.9 % bolus 1,000 mL (0 mLs Intravenous Stopped 04/10/16 1445)  iopamidol (ISOVUE-300) 61 % injection (100 mLs Intravenous Contrast Given 04/10/16 1402)  cefTRIAXone (ROCEPHIN) 2 g in dextrose 5 % 50 mL IVPB (2 g Intravenous New Bag/Given 04/10/16 1732)    And  metroNIDAZOLE (FLAGYL) IVPB 500 mg (500 mg Intravenous New Bag/Given 04/10/16 1732)     Initial Impression / Assessment and Plan / ED Course  I have reviewed the triage vital signs and the nursing notes.  Pertinent labs & imaging results that were available during my care of the patient were reviewed by me and considered in my medical decision making (see chart for details).  Clinical Course as of Apr 10 2152  Sat Apr 10, 2016  1459 I spoke with Dr Ezzard Standing who will come to see the patient.   [EW]    Clinical Course User Index [EW] Trixie Dredge, PA-C    Pt with three days of RLQ abdominal pain, N/V, anorexia found to have acute appendicitis.  Discussed pt with Dr Ezzard Standing who accepted patient for admission for OR.     Final Clinical Impressions(s) / ED Diagnoses   Final diagnoses:  Acute appendicitis, unspecified acute appendicitis type    New Prescriptions Current Discharge Medication List       Trixie Dredge, PA-C 04/10/16 2155    Alvira Monday, MD 04/11/16 [phone]

## 2016-04-10 NOTE — ED Notes (Signed)
Pt arrived back from CT

## 2016-04-11 MED ORDER — AMOXICILLIN-POT CLAVULANATE 875-125 MG PO TABS: 1.0000 | ORAL_TABLET | Freq: Two times a day (BID) | ORAL | 0 refills | Status: AC

## 2016-04-11 MED ORDER — HYDROCODONE-ACETAMINOPHEN 5-325 MG PO TABS: 1.0000 | ORAL_TABLET | Freq: Four times a day (QID) | ORAL | 0 refills | Status: AC | PRN

## 2016-04-11 NOTE — Discharge Instructions (Signed)
[facility] - DISCHARGE INSTRUCTIONS TO PATIENT  Activity:  Driving - May drive in 2 or 3 days   Lifting - No lifting more than 15 pounds for one week  Wound Care:   May shower starting tomorrow  Diet:  Full liquids today, regular food tomorrow  Follow up appointment:  Call Dr. Allene Pyo office [facility]) at [phone] for an appointment in 2 to 3 weeks.  Medications and dosages:  Resume your home medications.  You have a prescription for:  vicodin and Augmentin (for 5 days)  Call Dr. Ezzard Standing or his office  [phone]) if you have:  Temperature greater than 100.4,  Persistent nausea and vomiting,  Severe uncontrolled pain,  Redness, tenderness, or signs of infection (pain, swelling, redness, odor or green/yellow discharge around the site),  Difficulty breathing, headache or visual disturbances,  Any other questions or concerns you may have after discharge.  In an emergency, call 911 or go to an Emergency Department at a nearby hospital.

## 2016-04-11 NOTE — Discharge Summary (Signed)
Physician Discharge Summary  Patient ID:  [name] Stewart  MRN: [id number]  DOB/AGE: [birth year] 30 y.o.  Admit date: 04/10/2016 Discharge date: 04/11/2016  Discharge Diagnoses:   Active Problems:   Acute appendicitis  Operation: Procedure(s): APPENDECTOMY LAPAROSCOPIC on 04/10/2016 [name] Stewart  Discharged Condition: good  Hospital Course: [name] Stewart is an 31 y.o. female whose primary care physician is Kathreen Cosier, MD (Inactive) and who was admitted 04/10/2016 with a chief complaint of  Chief Complaint  Patient presents with  . Nausea  . Emesis  . Abdominal Pain  .   She was brought to the operating room on 04/10/2016 and underwent  Lap appendectomy. She is now one day post op, taking liquids. We will advance her to full liquids and if she tolerates this, let her go home.  I used the Education officer, environmental.  The discharge instructions were reviewed with the patient.  Consults: None  Significant Diagnostic Studies: Results for orders placed or performed during the hospital encounter of 04/10/16  Surgical PCR screen  Result Value Ref Range   MRSA, PCR NEGATIVE NEGATIVE   Staphylococcus aureus NEGATIVE NEGATIVE  CBC with Differential  Result Value Ref Range   WBC 16.2 (H) 4.0 - 10.5 K/uL   RBC 4.08 3.87 - 5.11 MIL/uL   Hemoglobin 12.7 12.0 - 15.0 g/dL   HCT 84.6 96.2 - 95.2 %   MCV 89.0 78.0 - 100.0 fL   MCH 31.1 26.0 - 34.0 pg   MCHC 35.0 30.0 - 36.0 g/dL   RDW 84.1 32.4 - 40.1 %   Platelets 267 150 - 400 K/uL   Neutrophils Relative % 86 %   Neutro Abs 14.0 (H) 1.7 - 7.7 K/uL   Lymphocytes Relative 7 %   Lymphs Abs 1.1 0.7 - 4.0 K/uL   Monocytes Relative 7 %   Monocytes Absolute 1.1 (H) 0.1 - 1.0 K/uL   Eosinophils Relative 0 %   Eosinophils Absolute 0.0 0.0 - 0.7 K/uL   Basophils Relative 0 %   Basophils Absolute 0.0 0.0 - 0.1 K/uL  Comprehensive metabolic panel  Result Value Ref Range   Sodium 135 135 - 145 mmol/L   Potassium 3.9 3.5 - 5.1 mmol/L    Chloride 104 101 - 111 mmol/L   CO2 25 22 - 32 mmol/L   Glucose, Bld 145 (H) 65 - 99 mg/dL   BUN 14 6 - 20 mg/dL   Creatinine, Ser 0.27 0.44 - 1.00 mg/dL   Calcium 8.9 8.9 - 25.3 mg/dL   Total Protein 7.4 6.5 - 8.1 g/dL   Albumin 4.0 3.5 - 5.0 g/dL   AST 18 15 - 41 U/L   ALT 18 14 - 54 U/L   Alkaline Phosphatase 67 38 - 126 U/L   Total Bilirubin 1.0 0.3 - 1.2 mg/dL   GFR calc non Af Amer >60 >60 mL/min   GFR calc Af Amer >60 >60 mL/min   Anion gap 6 5 - 15  Urinalysis, Routine w reflex microscopic  Result Value Ref Range   Color, Urine YELLOW YELLOW   APPearance HAZY (A) CLEAR   Specific Gravity, Urine 1.023 1.005 - 1.030   pH 6.0 5.0 - 8.0   Glucose, UA NEGATIVE NEGATIVE mg/dL   Hgb urine dipstick MODERATE (A) NEGATIVE   Bilirubin Urine NEGATIVE NEGATIVE   Ketones, ur 80 (A) NEGATIVE mg/dL   Protein, ur 30 (A) NEGATIVE mg/dL   Nitrite NEGATIVE NEGATIVE   Leukocytes, UA SMALL (A) NEGATIVE  RBC / HPF 0-5 0 - 5 RBC/hpf   WBC, UA 6-30 0 - 5 WBC/hpf   Bacteria, UA RARE (A) NONE SEEN   Squamous Epithelial / LPF 6-30 (A) NONE SEEN   Mucous PRESENT   Pregnancy, urine  Result Value Ref Range   Preg Test, Ur NEGATIVE NEGATIVE    Ct Abdomen Pelvis W Contrast  Result Date: 04/10/2016 CLINICAL DATA:  Nausea, vomiting and right lower abdominal pain 3 days. Right groin pain. EXAM: CT ABDOMEN AND PELVIS WITH CONTRAST TECHNIQUE: Multidetector CT imaging of the abdomen and pelvis was performed using the standard protocol following bolus administration of intravenous contrast. CONTRAST:  1 ISOVUE-300 IOPAMIDOL (ISOVUE-300) INJECTION 61% COMPARISON:  None. FINDINGS: Lower chest: Lung bases are within normal. Hepatobiliary: Normal. Pancreas: Normal. Spleen: Normal. Adrenals/Urinary Tract: Adrenal glands are normal. Kidneys are normal in size without hydronephrosis or nephrolithiasis. Ureters and bladder are normal. Stomach/Bowel: Stomach and small bowel are within normal. Appendix is severely  inflamed measuring 2 cm in diameter with adjacent inflammatory change and small amount of adjacent free fluid. There is an oval density rim calcified 2.3 cm structure at the origin of the appendix likely an appendicolith. No definite perforation or periappendiceal abscess. Colon is within normal. Vascular/Lymphatic: Vascular structures are within normal. No evidence of adenopathy. Reproductive: Within normal. Other: Mild to moderate amount of free pelvic fluid. Small umbilical hernia with diastases of the rectus abdominus muscles at the umbilicus and a few bowel loops extending into this region. Musculoskeletal: Within normal. IMPRESSION: Evidence of moderate acute appendicitis with 2.3 cm appendicolith at the origin of the appendix. No perforation or abscess. Small umbilical hernia. Critical Value/emergent results were called by telephone at the time of interpretation on 04/10/2016 at 2:37 pm to Dr. Trixie Dredge , who verbally acknowledged these results. Electronically Signed   By: Elberta Fortis M.D.   On: 04/10/2016 14:38    Discharge Exam:  Vitals:   04/11/16 0200 04/11/16 0547  BP: (!) 96/52 (!) 91/54  Pulse: 67 60  Resp: 15 15  Temp: 98.4 F (36.9 C) 98.2 F (36.8 C)    General: WN Hispanic F who is alert and generally healthy appearing.  Lungs: Clear to auscultation and symmetric breath sounds. Heart:  RRR. No murmur or rub. Abdomen: Soft. Has bowel sounds.  Incisions look good.  Discharge Medications:   Allergies as of 04/11/2016   No Known Allergies     Medication List    TAKE these medications   amoxicillin-clavulanate 875-125 MG tablet Commonly known as:  AUGMENTIN Take 1 tablet by mouth 2 (two) times daily.   HYDROcodone-acetaminophen 5-325 MG tablet Commonly known as:  NORCO/VICODIN Take 1-2 tablets by mouth every 6 (six) hours as needed for moderate pain.   ibuprofen 200 MG tablet Commonly known as:  ADVIL,MOTRIN Take 400 mg by mouth every 6 (six) hours as needed.        Disposition: 01-Home or Self Care  Discharge Instructions    Diet - low sodium heart healthy    Complete by:  As directed    Increase activity slowly    Complete by:  As directed          Signed: Ovidio Kin, M.D., [facility] Office:  [phone]  04/11/2016, 8:40 AM

## 2016-04-11 NOTE — Care Management Note (Signed)
Case Management Note  Patient Details  Name: [name] Stewart MRN: [id number] Date of Birth: [birth year]  Subjective/Objective:    Appendectomy Laparoscopic                Action/Plan: Discharge Planning: NCM spoke to pt's husband at bedside. Provided pt with goodrx coupon for Augmentin for $10. And information on [facility] to schedule follow up appt.   Expected Discharge Date:  04/11/16               Expected Discharge Plan:  Home/Self Care  In-House Referral:  NA  Discharge planning Services  CM Consult, Medication Assistance  Post Acute Care Choice:  NA Choice offered to:  NA  DME Arranged:  N/A DME Agency:  NA  HH Arranged:  NA HH Agency:  NA  Status of Service:  Completed, signed off  If discussed at Long Length of Stay Meetings, dates discussed:    Additional Comments:  Elliot Cousin, RN 04/11/2016, 11:34 AM

## 2016-04-11 NOTE — Progress Notes (Signed)
Nurse reviewed discharge instructions with pt and her husband.  Discharge instructions printed in spanish and english for pt.  Pt and husband verbalized understanding of discharge instructions, follow up appointments and new medications.  Prescription given to pt by MD prior to discharge and nurse gave pt coupon for augmentin prior to discharge.

## 2016-04-11 NOTE — Progress Notes (Signed)
[facility] Office:  [phone] General Surgery Progress Note   LOS: 0 days  POD -  1 Day Post-Op  Chief Complaint: Abdominal pain  Assessment and Plan: 1.  APPENDECTOMY LAPAROSCOPIC - 04/10/2016 - D. Soriya Worster  On Zosyn  Taking liquids - will advance to full liquids - if she does well, home later today  2.  DVT prophylaxis - SQ heparin   Active Problems:   Acute appendicitis  Subjective:  Doing well.  Husband in room. I used Education officer, environmental.  Objective:   Vitals:   04/11/16 0200 04/11/16 0547  BP: (!) 96/52 (!) 91/54  Pulse: 67 60  Resp: 15 15  Temp: 98.4 F (36.9 C) 98.2 F (36.8 C)     Intake/Output from previous day:  04/07 0701 - 04/08 0700 In: 1530 [P.O.:380; I.V.:1150] Out: 227 [Urine:202; Blood:25]  Intake/Output this shift:  No intake/output data recorded.   Physical Exam:   General: WN Hispanic female who is alert and oriented.    HEENT: Normal. Pupils equal. .   Lungs: Clear   Abdomen: Soft, has BS   Wound: Clean   Lab Results:    Recent Labs  04/10/16 1259  WBC 16.2*  HGB 12.7  HCT 36.3  PLT 267    BMET   Recent Labs  04/10/16 1259  NA 135  K 3.9  CL 104  CO2 25  GLUCOSE 145*  BUN 14  CREATININE 0.83  CALCIUM 8.9    PT/INR  No results for input(s): LABPROT, INR in the last 72 hours.  ABG  No results for input(s): PHART, HCO3 in the last 72 hours.  Invalid input(s): PCO2, PO2   Studies/Results:  Ct Abdomen Pelvis W Contrast  Result Date: 04/10/2016 CLINICAL DATA:  Nausea, vomiting and right lower abdominal pain 3 days. Right groin pain. EXAM: CT ABDOMEN AND PELVIS WITH CONTRAST TECHNIQUE: Multidetector CT imaging of the abdomen and pelvis was performed using the standard protocol following bolus administration of intravenous contrast. CONTRAST:  1 ISOVUE-300 IOPAMIDOL (ISOVUE-300) INJECTION 61% COMPARISON:  None. FINDINGS: Lower chest: Lung bases are within normal. Hepatobiliary: Normal. Pancreas: Normal.  Spleen: Normal. Adrenals/Urinary Tract: Adrenal glands are normal. Kidneys are normal in size without hydronephrosis or nephrolithiasis. Ureters and bladder are normal. Stomach/Bowel: Stomach and small bowel are within normal. Appendix is severely inflamed measuring 2 cm in diameter with adjacent inflammatory change and small amount of adjacent free fluid. There is an oval density rim calcified 2.3 cm structure at the origin of the appendix likely an appendicolith. No definite perforation or periappendiceal abscess. Colon is within normal. Vascular/Lymphatic: Vascular structures are within normal. No evidence of adenopathy. Reproductive: Within normal. Other: Mild to moderate amount of free pelvic fluid. Small umbilical hernia with diastases of the rectus abdominus muscles at the umbilicus and a few bowel loops extending into this region. Musculoskeletal: Within normal. IMPRESSION: Evidence of moderate acute appendicitis with 2.3 cm appendicolith at the origin of the appendix. No perforation or abscess. Small umbilical hernia. Critical Value/emergent results were called by telephone at the time of interpretation on 04/10/2016 at 2:37 pm to Dr. Trixie Dredge , who verbally acknowledged these results. Electronically Signed   By: Elberta Fortis M.D.   On: 04/10/2016 14:38     Anti-infectives:   Anti-infectives    Start     Dose/Rate Route Frequency Ordered Stop   04/10/16 2215  piperacillin-tazobactam (ZOSYN) IVPB 3.375 g     3.375 g 12.5 mL/hr over 240 Minutes Intravenous Every  8 hours 04/10/16 2200     04/10/16 1615  cefTRIAXone (ROCEPHIN) 2 g in dextrose 5 % 50 mL IVPB     2 g 100 mL/hr over 30 Minutes Intravenous  Once 04/10/16 1603 04/10/16 1802   04/10/16 1615  metroNIDAZOLE (FLAGYL) IVPB 500 mg     500 mg 100 mL/hr over 60 Minutes Intravenous  Once 04/10/16 1603 04/10/16 1832      Ovidio Kin, MD, FACS Pager: [phone] [facility] Office: [phone] 04/11/2016

## 2016-04-12 ENCOUNTER — Encounter (HOSPITAL_COMMUNITY): Payer: Self-pay | Admitting: Surgery

## 2019-02-19 IMAGING — CT CT ABD-PELV W/ CM
2 of 4 series · 16 of 46 positions shown, 18 images · IV contrast (iopamidol)
Comparison: None.

CLINICAL DATA: Nausea, vomiting and right lower abdominal pain 3
days. Right groin pain.

EXAM:
CT ABDOMEN AND PELVIS WITH CONTRAST
TECHNIQUE: Multidetector CT imaging of the abdomen and pelvis was performed
using the standard protocol following bolus administration of
intravenous contrast.
CONTRAST:  1 A9MNG3-M99 IOPAMIDOL (A9MNG3-M99) INJECTION 61%

[Series 2: abd/pel with · axial · 0.80mm/px · z∈[-296,+124]mm · 13 of 94 slices shown, 15 images]
[im 5/94  soft-tissue]
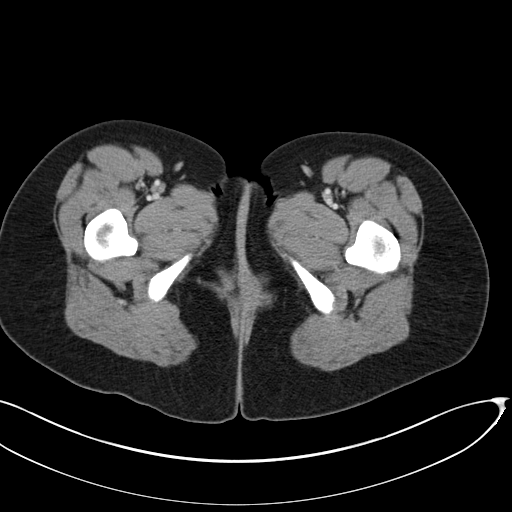
[im 5/94  bone]
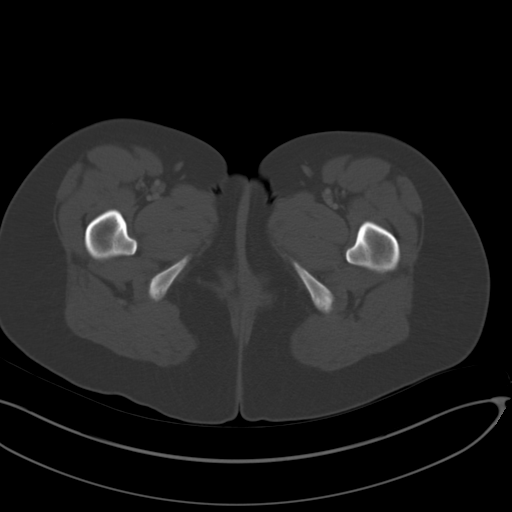
[im 15/94  soft-tissue]
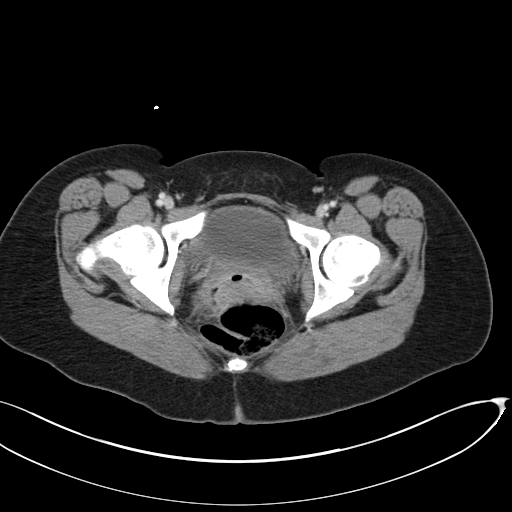
[im 20/94  soft-tissue]
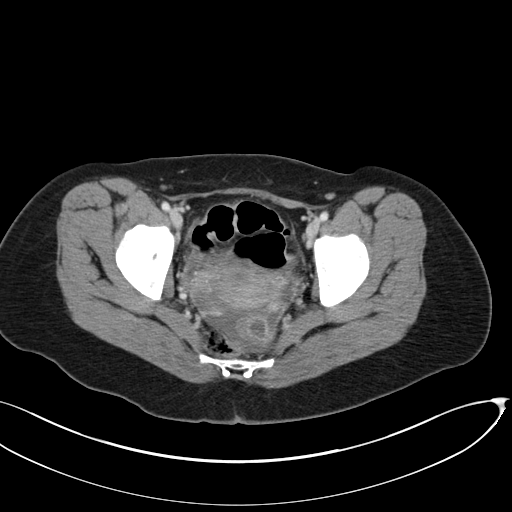
[im 25/94  soft-tissue]
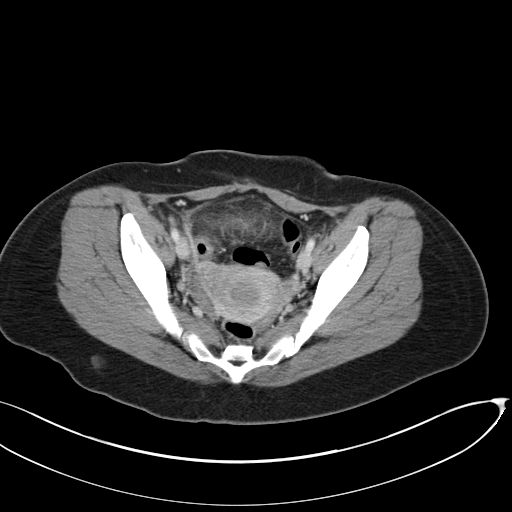
[im 35/94  soft-tissue]
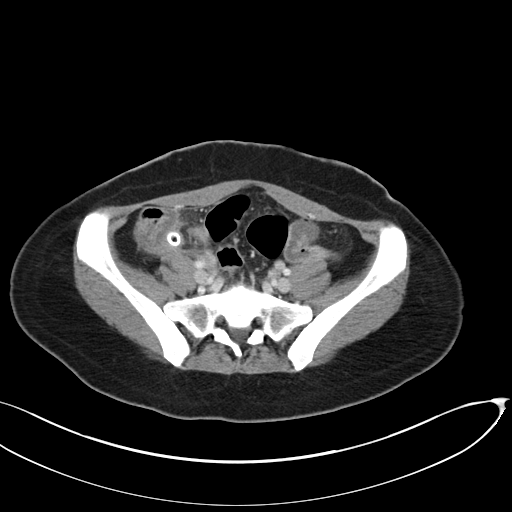
[im 40/94  soft-tissue]
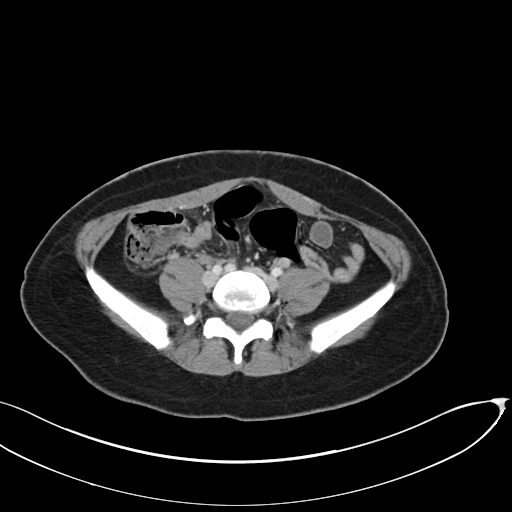
[im 49/94  soft-tissue]
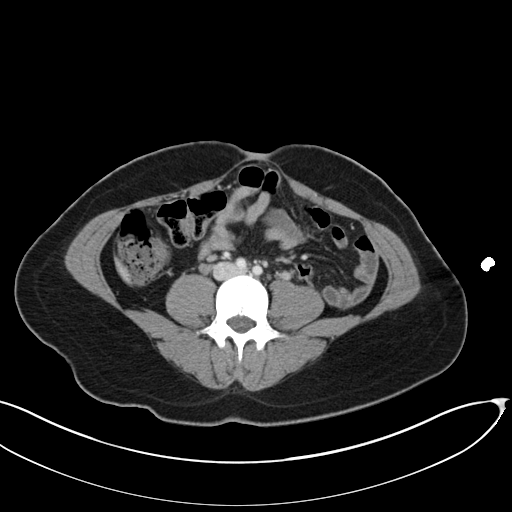
[im 54/94  soft-tissue]
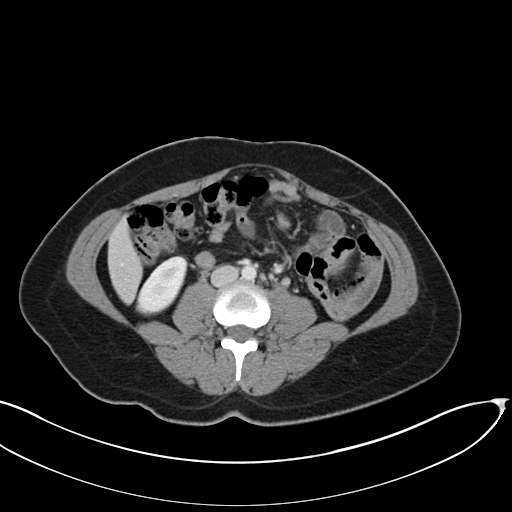
[im 59/94  soft-tissue]
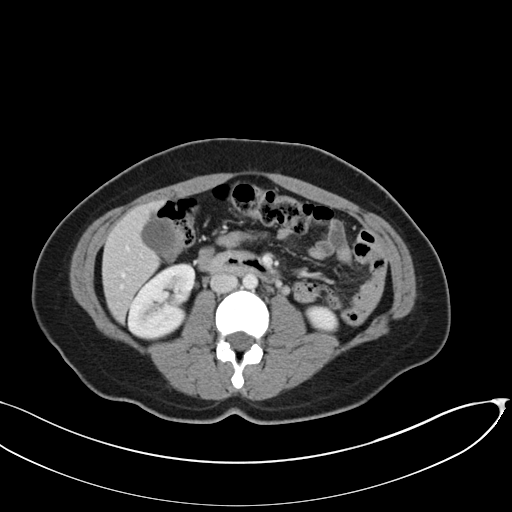
[im 59/94  bone]
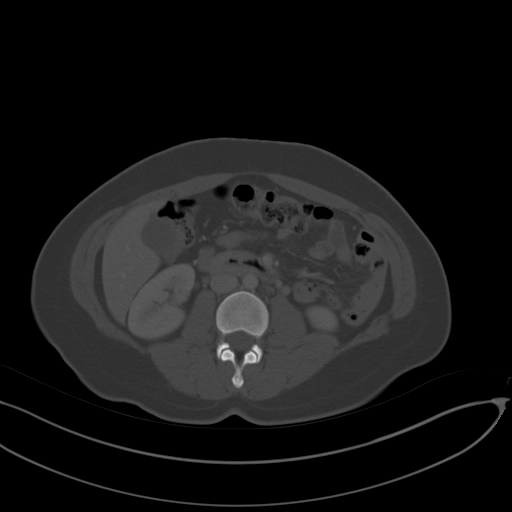
[im 69/94  soft-tissue]
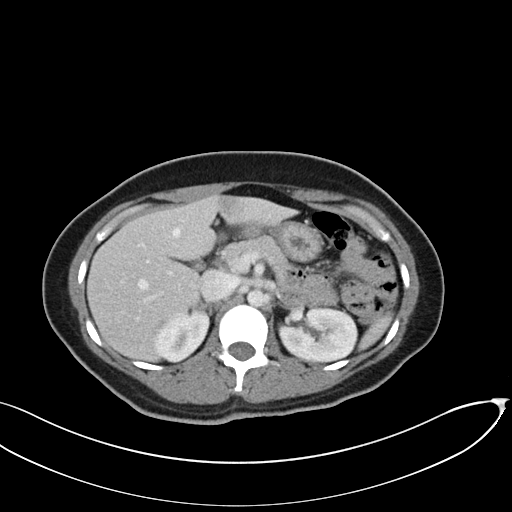
[im 74/94  soft-tissue]
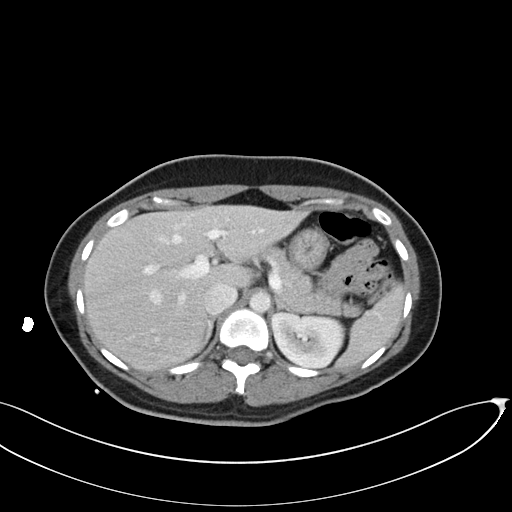
[im 79/94  soft-tissue]
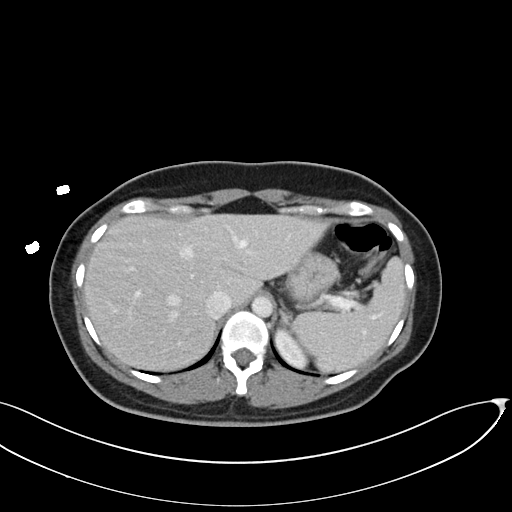
[im 89/94  soft-tissue]
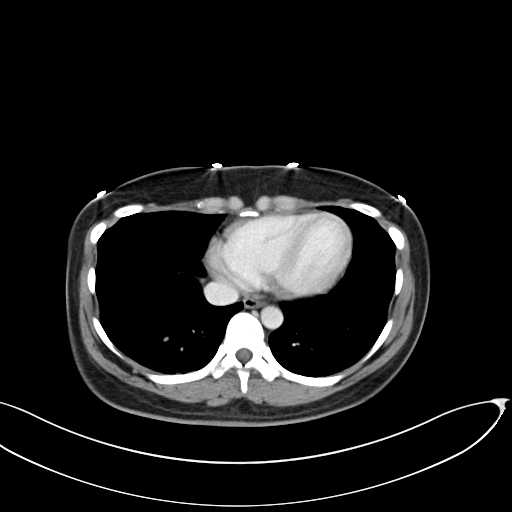

[Series 3: coronal a/|p · coronal · 0.74mm/px · 3 of 143 slices shown]
[im 48/143  soft-tissue]
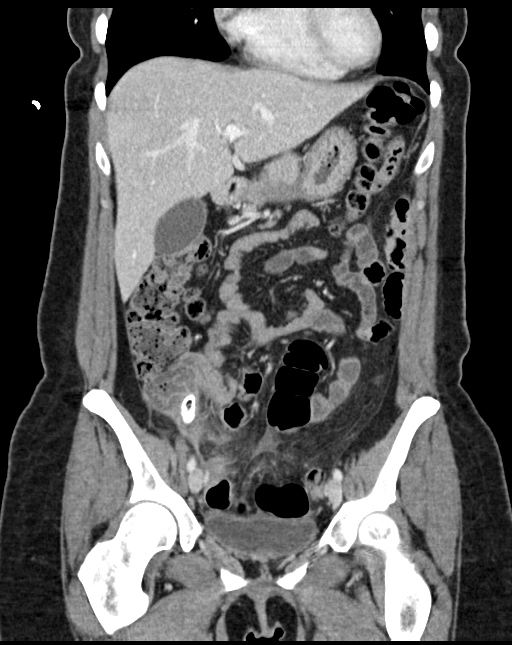
[im 64/143  soft-tissue]
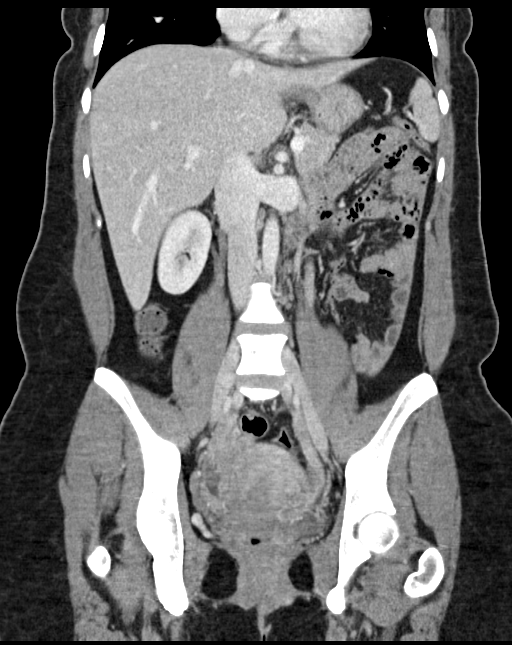
[im 79/143  soft-tissue]
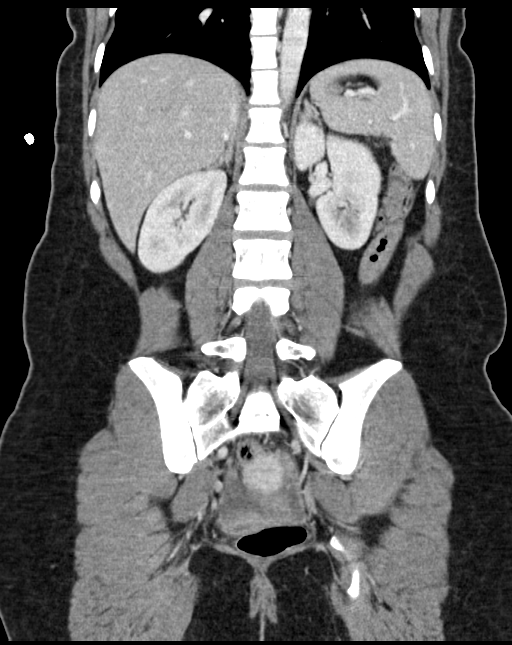

[16 of 46 positions shown; findings below may reference images not displayed]

FINDINGS: Lower chest: Lung bases are within normal.

Hepatobiliary: Normal.

Pancreas: Normal.

Spleen: Normal.

Adrenals/Urinary Tract: Adrenal glands are normal. Kidneys are
normal in size without hydronephrosis or nephrolithiasis. Ureters
and bladder are normal.

Stomach/Bowel: Stomach and small bowel are within normal. Appendix
is severely inflamed measuring 2 cm in diameter with adjacent
inflammatory change and small amount of adjacent free fluid. There
is an oval density rim calcified 2.3 cm structure at the origin of
the appendix likely an appendicolith. No definite perforation or
periappendiceal abscess. Colon is within normal.

Vascular/Lymphatic: Vascular structures are within normal. No
evidence of adenopathy.

Reproductive: Within normal.

Other: Mild to moderate amount of free pelvic fluid. Small umbilical
hernia with diastases of the rectus abdominus muscles at the
umbilicus and a few bowel loops extending into this region.

Musculoskeletal: Within normal.
IMPRESSION: Evidence of moderate acute appendicitis with 2.3 cm appendicolith at
the origin of the appendix. No perforation or abscess.

Small umbilical hernia.

Critical Value/emergent results were called by telephone at the time
of interpretation on 04/10/2016 at [DATE] to Dr. FABRINA MISSIAS , who
verbally acknowledged these results.
